# Patient Record
Sex: Male | Born: 1960 | Race: White | Hispanic: No | Marital: Married | Smoking: Never smoker
Health system: Southern US, Community
[De-identification: ages and names within clinical notes are randomized; demographics above are authoritative.]

## PROBLEM LIST (undated history)

## (undated) DIAGNOSIS — A4902 Methicillin resistant Staphylococcus aureus infection, unspecified site: Secondary | ICD-10-CM

## (undated) DIAGNOSIS — R7303 Prediabetes: Secondary | ICD-10-CM

## (undated) DIAGNOSIS — M1990 Unspecified osteoarthritis, unspecified site: Secondary | ICD-10-CM

## (undated) DIAGNOSIS — E785 Hyperlipidemia, unspecified: Secondary | ICD-10-CM

## (undated) DIAGNOSIS — I10 Essential (primary) hypertension: Secondary | ICD-10-CM

## (undated) DIAGNOSIS — C801 Malignant (primary) neoplasm, unspecified: Secondary | ICD-10-CM

## (undated) DIAGNOSIS — K219 Gastro-esophageal reflux disease without esophagitis: Secondary | ICD-10-CM

## (undated) DIAGNOSIS — G4730 Sleep apnea, unspecified: Secondary | ICD-10-CM

## (undated) HISTORY — DX: Hyperlipidemia, unspecified: E78.5

## (undated) HISTORY — DX: Methicillin resistant Staphylococcus aureus infection, unspecified site: A49.02

## (undated) HISTORY — DX: Sleep apnea, unspecified: G47.30

---

## 1999-05-04 ENCOUNTER — Ambulatory Visit: Admission: RE | Admit: 1999-05-04 | Discharge: 1999-05-04 | Payer: Self-pay | Admitting: Internal Medicine

## 2005-07-05 ENCOUNTER — Ambulatory Visit: Payer: Self-pay | Admitting: Internal Medicine

## 2006-07-04 ENCOUNTER — Ambulatory Visit: Payer: Self-pay | Admitting: Internal Medicine

## 2007-07-02 DIAGNOSIS — G4733 Obstructive sleep apnea (adult) (pediatric): Secondary | ICD-10-CM | POA: Insufficient documentation

## 2007-07-02 DIAGNOSIS — J302 Other seasonal allergic rhinitis: Secondary | ICD-10-CM | POA: Insufficient documentation

## 2007-07-02 DIAGNOSIS — J3089 Other allergic rhinitis: Secondary | ICD-10-CM

## 2007-07-03 ENCOUNTER — Ambulatory Visit: Payer: Self-pay | Admitting: Internal Medicine

## 2007-07-03 ENCOUNTER — Telehealth (INDEPENDENT_AMBULATORY_CARE_PROVIDER_SITE_OTHER): Payer: Self-pay | Admitting: *Deleted

## 2007-07-03 DIAGNOSIS — E785 Hyperlipidemia, unspecified: Secondary | ICD-10-CM | POA: Insufficient documentation

## 2008-07-01 ENCOUNTER — Ambulatory Visit: Payer: Self-pay | Admitting: Internal Medicine

## 2009-06-30 ENCOUNTER — Ambulatory Visit: Payer: Self-pay | Admitting: Internal Medicine

## 2009-07-22 ENCOUNTER — Telehealth (INDEPENDENT_AMBULATORY_CARE_PROVIDER_SITE_OTHER): Payer: Self-pay | Admitting: *Deleted

## 2009-07-27 ENCOUNTER — Encounter: Payer: Self-pay | Admitting: Internal Medicine

## 2010-03-30 ENCOUNTER — Telehealth (INDEPENDENT_AMBULATORY_CARE_PROVIDER_SITE_OTHER): Payer: Self-pay | Admitting: *Deleted

## 2010-05-25 NOTE — Progress Notes (Signed)
Summary: nasocort refill - LMTCB x1  Phone Note Call from Patient Call back at Home Phone [phone]   Caller: Patient Call For: young Reason for Call: Refill Medication, Talk to Nurse Summary of Call: Patient needing refill--nasacort--[facility] [place] Initial call taken by: Lehman Prom,  March 30, 2010 8:35 AM  Follow-up for Phone Call        last ov w/ CDY 3.8.11, next ov 3.6.12.  LMOM TCB to verify med pt is requesting refilled.  if this is correct, can refill the nasocort. Boone Master CNA/MA  March 30, 2010 9:39 AM   this is the med that is needed wife aware refill sent to pharmacy Follow-up by: Philipp Deputy CMA,  March 30, 2010 4:19 PM    Prescriptions: NASACORT AQ 55 MCG/ACT  AERS (TRIAMCINOLONE ACETONIDE(NASAL)) prn  #1 x 3   Entered by:   Philipp Deputy CMA   Authorized by:   Waymon Budge MD   Signed by:   Philipp Deputy CMA on 03/30/2010   Method used:   Electronically to        [facility] [phone]* (retail)       [address]       [place], [place]  [address]       Ph: [phone]       Fax: [phone]   RxID:   [id number]

## 2010-05-25 NOTE — Medication Information (Signed)
Summary: Tax adviser   Imported By: Valinda Hoar 07/27/2009 07:52:31  _____________________________________________________________________  External Attachment:    Type:   Image     Comment:   External Document

## 2010-05-25 NOTE — Progress Notes (Signed)
Summary: PA for Allegra D  Phone Note Outgoing Call   Call placed by: Vernie Murders,  July 22, 2009 5:02 PM Call placed to: Insurer Summary of Call: [employer] and initiated PA for [employer] D 12 hr.  Will await PA form to place in Dr Roxy Cedar lookat.  Case ID number is [id number]. Initial call taken by: Vernie Murders,  July 22, 2009 5:04 PM  Follow-up for Phone Call        PA form was received and placed in Dr Roxy Cedar lookat to be completed. Vernie Murders  July 22, 2009 5:17 PM  Letter approval was recieived and will be given to front desk to be scanned in. Follow-up by: Vernie Murders,  July 24, 2009 3:22 PM

## 2010-05-25 NOTE — Assessment & Plan Note (Signed)
Summary: 12 months/apc   Primary Provider/Referring Provider:  Dr. Larina Bras  CC:  Yearly follow up visit-needs refills on Allegra D and CPAP machine.Marland Kitchen  History of Present Illness: 3/09- One year follow up. Very good compliance. He continues to use cpap at 13cwp every night and feels it works well. we discussed cpap management. Spring pollen bothers him a little. he usually uses allegraD-12, once daily. Nasacort AQ also helps. denies chest discomfort.  07/11/08- OSA, allergic rhinitis Allergy- not much trouble. Didn't need acute visit for sinus discomfort last year. Has Allegra and Nasacort as needed. Sleep - Still using cpap 13- Advanced. Says nasal mask works ok. Machine is beginning to sound different, suggesting it is wearing out. He feels dependent on it and uses every night. Denies being told he snores through it and is not sleepy in day time. 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  June 30, 2009- OSA, allergic rhinitis Continues conmfortably using CPAP all night every night without problems.  Pressure remains at 13. Rarely feels need for his humidifier. Prefers nasal mask. Denies snore-through or daytime sleepiness.. Allergy- eyes started watering in past 2 weeks. He resumed allegra-D. Used Nasacort very rarely. Denies cough, wheeze, chest pain or palpitation.   Current Medications (verified): 1)  Allegra-D 12 Hour 60-120 Mg  Tb12 (Fexofenadine-Pseudoephedrine) .... As Needed 2)  Lipitor 20 Mg  Tabs (Atorvastatin Calcium) 3)  Adult Aspirin Ec Low Strength 81 Mg  Tbec (Aspirin) .... Take 1 Tablet By Mouth Once A Day 4)  Cpap .Marland Kitchen.. 13cwp 5)  Nasacort Aq 55 Mcg/act  Aers (Triamcinolone Acetonide(Nasal)) .... Prn 6)  Multivitamins  Tabs (Multiple Vitamin) .... Take 1 By Mouth Once Daily  Allergies (verified): 1)  ! Sulfa 2)  ! 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    Vital Signs:  Patient profile:   49 year old male Height:      70 inches Weight:      220 pounds BMI:     31.68 O2 Sat:      96 % on Room air Pulse rate:   79 / minute BP sitting:   126 / 84  (right arm) Cuff size:   regular  Vitals Entered By: Vivianne Spence  O2 Flow:  Room air  Physical Exam  Additional Exam:  General: A/Ox3; pleasant and cooperative, NAD, WDWN, appropt=riate and interactive- not sleepy appearing SKIN: no rash, lesions NODES: no lymphadenopathy HEENT: [place]/AT, EOM- WNL, Conjuctivae- clear, PERRLA, TM-WNL, Nose- clear, Throat- clear and wnl, Mallampati  IV NECK: Supple w/ fair ROM, JVD- none, normal carotid impulses w/o bruits Thyroid- normal to palpation CHEST: Clear to P&A HEART: RRR, no m/g/r heard ABDOMEN: Soft and nl; VWU:JWJX, nl pulses, no edema  NEURO: Grossly intact to observation, no tremor      Impression & Recommendations:  Problem # 1:  OBSTRUCTIVE SLEEP APNEA (ICD-327.23)  Great CPAP compliance and control. Weight and sleep  hygiene are appropriate.   Orders: Est. Patient Level III ([address])  Problem # 2:  ALLERGIC RHINITIS (ICD-477.9)  Mild seasonal rhinoconjunctivitis. We will not refill the steroid nasal spray for now. Discussed otc status of Allegra-D. His updated medication list for this problem includes:    Nasacort Aq 55 Mcg/act Aers (Triamcinolone acetonide(nasal))  .Marland Kitchen... Prn  Orders: Est. Patient Level III ([address])  Medications Added to Medication List This Visit: 1)  Cpap 13 Advanced  2)  Multivitamins Tabs (Multiple vitamin) .... Take 1 by mouth once daily  Patient Instructions: 1)  Schedule return in one year, earlier if needed 2)  continue CPAP 13. Call us or your DME company as needed.

## 2010-06-29 ENCOUNTER — Encounter: Payer: Self-pay | Admitting: Internal Medicine

## 2010-06-29 ENCOUNTER — Ambulatory Visit (INDEPENDENT_AMBULATORY_CARE_PROVIDER_SITE_OTHER): Payer: 59 | Admitting: Internal Medicine

## 2010-06-29 DIAGNOSIS — J309 Allergic rhinitis, unspecified: Secondary | ICD-10-CM

## 2010-06-29 DIAGNOSIS — G4733 Obstructive sleep apnea (adult) (pediatric): Secondary | ICD-10-CM

## 2010-07-06 NOTE — Assessment & Plan Note (Signed)
Summary: 12 month follow up   Primary Provider/Referring Provider:  Dr. Larina Bras  CC:  Yearly follow up visit-sleep apnea. Wearing CPAP each night and doing well.Marland Kitchen  History of Present Illness: 07/11/08- OSA, allergic rhinitis Allergy- not much trouble. Didn't need acute visit for sinus discomfort last year. Has Allegra and Nasacort as needed. Sleep - Still using cpap 13- Advanced. Says nasal mask works ok. Machine is beginning to sound different, suggesting it is wearing out. He feels dependent on it and uses every night. Denies being told he snores through it and is not sleepy in day time. 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  June 30, 2009- OSA, allergic rhinitis Continues conmfortably using CPAP all night every night without problems.  Pressure remains at 13. Rarely feels need for his humidifier. Prefers nasal mask. Denies snore-through or daytime sleepiness.. Allergy- eyes started watering in past 2 weeks. He resumed allegra-D. Used Nasacort very rarely. Denies cough, wheeze, chest pain or palpitation.  June 29, 2010  OSA, allergic rhinitis Nurse-CC: Yearly follow up visit-sleep apnea. Wearing CPAP each night and doing well. CPAP13- doing very well used all night, every night. Satisfied with DME company and nasal mask.  Allergies controlled without unusual problems for the tme of year. He is taking daily Allegra-D over the past 2 weeks- sufficient. He has Nasacort but hasn't resumed it.      Preventive Screening-Counseling & Management  Alcohol-Tobacco     Smoking Status: never  Current Medications (verified): 1)  Allegra-D 12 Hour 60-120 Mg  Tb12 (Fexofenadine-Pseudoephedrine) .... As Needed 2)  Lipitor 20 Mg  Tabs (Atorvastatin Calcium) 3)  Adult Aspirin Ec Low Strength 81 Mg  Tbec (Aspirin) .... Take 1 Tablet By Mouth Once A Day 4)  Cpap 13 Advanced 5)  Nasacort Aq 55 Mcg/act  Aers (Triamcinolone Acetonide(Nasal)) .... Prn 6)   Multivitamins  Tabs (Multiple Vitamin) .... Take 1 By Mouth Once Daily  Allergies (verified): 1)  ! Sulfa 2)  ! 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  The patient denies shortness of breath with activity, shortness of breath at rest, productive cough, non-productive cough, coughing up blood, chest pain, irregular heartbeats, acid heartburn, indigestion, loss of appetite, weight change, abdominal pain, difficulty swallowing, sore throat, tooth/dental problems, headaches, nasal congestion/difficulty breathing through nose, ear ache, anxiety, hand/feet swelling, rash, change in color of mucus, and fever.    Vital Signs:  Patient profile:   50 year old male Height:      70 inches Weight:      240.13 pounds BMI:     34.58 O2 Sat:      94 % on Room air Pulse rate:   91 / minute BP sitting:   136 / 76  (left arm) Cuff size:   regular  Vitals Entered By: Reynaldo Minium CMA (June 29, 2010 10:20 AM)  O2 Flow:  Room air CC: Yearly follow up visit-sleep apnea. Wearing CPAP each night and doing well.   Physical Exam  Additional Exam:  General:  A/Ox3; pleasant and cooperative, NAD, WDWN, appropt=riate and interactive- not sleepy appearing SKIN: no rash, lesions NODES: no lymphadenopathy HEENT: [place]/AT, EOM- WNL, Conjuctivae- clear, PERRLA, TM-WNL, Nose- R>L nare congested with some crusting and turbinate edema. , Throat- clear and wnl, Mallampati  IV NECK: Supple w/ fair ROM, JVD- none, normal carotid impulses w/o bruits Thyroid- normal to  palpation CHEST: Clear to P&A HEART: RRR, no m/g/r heard ABDOMEN: Soft and nl; ZOX:WRUE, nl pulses, no edema  NEURO: Grossly intact to observation, no tremor      Impression & Recommendations:  Problem # 1:  OBSTRUCTIVE SLEEP APNEA (ICD-327.23)  Good compliance and control with CPAP. We discussed comfort features.  Problem # 2:  ALLERGIC RHINITIS (ICD-477.9)  Marginally adequate control with daily Allegra-D. I suggested he resume his Nasacort now and suggested he might try otc cromolyn as an alternative.  His updated medication list for this problem includes:    Nasacort Aq 55 Mcg/act Aers (Triamcinolone acetonide(nasal)) .Marland Kitchen... 1-2 sprays each nostril once daily at bedtime  Medications Added to Medication List This Visit: 1)  Nasacort Aq 55 Mcg/act Aers (Triamcinolone acetonide(nasal)) .Marland Kitchen.. 1-2 sprays each nostril once daily at bedtime  Other Orders: Est. Patient Level III ([address])  Patient Instructions: 1)  Please schedule a follow-up appointment in 1 year. 2)  Continue CPAP 3)  Refill Nasacort sent to drug store 4)  Consider as an alternative or additional nose spray, trying otc 5)       Nasalcrom/ cromolyn 6)  follow directions on box. This might cause less nose bleed. Prescriptions: NASACORT AQ 55 MCG/ACT  AERS (TRIAMCINOLONE ACETONIDE(NASAL)) 1-2 sprays each nostril once daily at bedtime  #1 x 12   Entered and Authorized by:   Waymon Budge MD   Signed by:   Waymon Budge MD on 06/29/2010   Method used:   Electronically to        [facility] [phone]* (retail)       [address]       [place], [place]  [address]       Ph: [phone]       Fax: [phone]   RxID:   [id number]

## 2010-09-10 NOTE — Assessment & Plan Note (Signed)
[facility]                             PULMONARY OFFICE NOTE   Douglas Grimes, Douglas Grimes                    MRN:          [id number]  DATE:07/03/2005                            DOB:          28-Jul-1960    PROBLEM:  1. Obstructive sleep apnea.  2. Allergic rhinitis.   HISTORY:  One year followup.  He says he is doing very well still with  CPAP at 13 CWP.  Old machine was replaced, but no other changes needed.  In the last week as spring pollens start, he has noted increased nasal  congestion and some mildly congestive cough.  His primary physician gave  him a Z-Pak.  He has been using Allegra D occasionally and we spent an  additional 20 minutes discussing decongestants, antihistamines,  available preparations and a comparison with nasal steroids.  He has  tried Alavert D but did not find it helpful, preferring Allegra D.   MEDICATION:  1. Allegra D 12 hour used p.r.n.  2. Lipitor.  3. Aspirin 81 mg.  4. CPAP at 13 CWP.   DRUG INTOLERANCE:  SULFA AND CONTRAST DYE.   OBJECTIVE:  Weight 206 pounds.  BP 110/78, pulse 89, room air saturation  99%.  Nasal airway is not obstructed, but there is some mucus bridging.  Conjunctivae are clear.  Voice quality seems normal.  HEART:  Sounds are regular without murmur.  LUNGS:  Are clear.   IMPRESSION:  1. Obstructive sleep apnea is well controlled on CPAP at 13 CWP.  2. Rhinitis, possibly with an allergic component now.   PLAN:  1. Continue CPAP at 13 CWP.  2. Medication talk.  3. Trial of Nasacort AQ 2 sprays each nostril daily.  4. May try any of the comparison antihistamine/decongestant      preparations as discussed.  5. Schedule return in 1 year but earlier as needed.    Douglas D. Maple Hudson, MD, [name], FACP  Electronically Signed   CDY/MedQ  DD: 07/04/2006  DT: 07/06/2006  Job #: [id number]

## 2011-06-28 ENCOUNTER — Encounter: Payer: Self-pay | Admitting: Internal Medicine

## 2011-06-29 ENCOUNTER — Ambulatory Visit (INDEPENDENT_AMBULATORY_CARE_PROVIDER_SITE_OTHER): Payer: 59 | Admitting: Internal Medicine

## 2011-06-29 ENCOUNTER — Encounter: Payer: Self-pay | Admitting: Internal Medicine

## 2011-06-29 VITALS — BP 140/88 | HR 96 | Ht 72.0 in | Wt 234.6 lb

## 2011-06-29 DIAGNOSIS — G4733 Obstructive sleep apnea (adult) (pediatric): Secondary | ICD-10-CM

## 2011-06-29 DIAGNOSIS — J3089 Other allergic rhinitis: Secondary | ICD-10-CM

## 2011-06-29 DIAGNOSIS — J309 Allergic rhinitis, unspecified: Secondary | ICD-10-CM

## 2011-06-29 MED ORDER — TRIAMCINOLONE ACETONIDE(NASAL) 55 MCG/ACT NA INHA: 2.0000 | Freq: Every day | NASAL | Status: DC

## 2011-06-29 NOTE — Patient Instructions (Signed)
Order- DME- replacement CPAP machine 13 cwp, heated humidifier, mask of choice and supplies  Script to refill Nasacort AQ  Ok to continue antihistamine/ decongestant as needed- like Allegra-D  Please call as needed

## 2011-06-29 NOTE — Progress Notes (Signed)
06/29/11- 50 yoM never smoker followed for OSA, allergic rhinitis LOV-06/29/10 He returns for CPAP followup able to use his machine all night every night at 13/Advanced. Using a nasal mask and humidifier. Perennial allergy symptoms with potential seasonal flare which has not developed yet. A little itching of the eyes. No wheezing. He will resume Nasacort and antihistamine as needed. 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 Denies ear discomfort with pressure changes.  ROS-see HPI Constitutional:   No-   weight loss, night sweats, fevers, chills, fatigue, lassitude. HEENT:   No-  headaches, difficulty swallowing, tooth/dental problems, sore throat,       Some  sneezing, itching,  No-ear ache, nasal congestion, post nasal drip,  CV:  No-   chest pain, orthopnea, PND, swelling in lower extremities, anasarca,  dizziness, palpitations Resp: No-   shortness of breath with exertion or at rest.              No-   productive cough,  No non-productive cough,  No- coughing up of blood.              No-   change in color of mucus.  No- wheezing.   Skin: No-   rash or lesions. GI:  No-   heartburn, indigestion, abdominal pain, nausea, vomitinge GU:  MS:  No-   joint pain or swelling.   Neuro-     nothing unusual Psych:  No- change in mood or affect. No depression or anxiety.  No memory loss.  OBJ- Physical Exam General- Alert, Oriented, Affect-appropriate, Distress- none acute Skin- rash-none, lesions- none, excoriation- none Lymphadenopathy- none Head- atraumatic            Eyes- Gross vision intact, PERRLA, conjunctivae and secretions clear            Ears- Hearing, canals-normal            Nose- Clear, no-Septal dev, mucus, polyps, erosion, perforation             Throat- Mallampati II-III , mucosa clear , drainage- none, tonsils- atrophic Neck- flexible , trachea midline, no stridor , thyroid nl, carotid no bruit Chest - symmetrical excursion , unlabored           Heart/CV- RRR , no  murmur , no gallop  , no rub, nl s1 s2                           - JVD- none , edema- none, stasis changes- none, varices- none           Lung- clear to P&A, wheeze- none, cough- none , dullness-none, rub- none           Chest wall-  Abd-  Br/ Gen/ Rectal- Not done, not indicated Extrem- cyanosis- none, clubbing, none, atrophy- none, strength- nl Neuro- grossly intact to observation

## 2011-07-03 NOTE — Assessment & Plan Note (Signed)
Good compliance and control, and no concerns.

## 2011-07-03 NOTE — Assessment & Plan Note (Signed)
He will add nasal steroid and antihistamines back as needed during the spring season.

## 2011-07-07 ENCOUNTER — Encounter: Payer: Self-pay | Admitting: Internal Medicine

## 2012-06-28 ENCOUNTER — Encounter: Payer: Self-pay | Admitting: Internal Medicine

## 2012-06-28 ENCOUNTER — Ambulatory Visit (INDEPENDENT_AMBULATORY_CARE_PROVIDER_SITE_OTHER): Payer: 59 | Admitting: Internal Medicine

## 2012-06-28 VITALS — BP 140/82 | HR 96 | Ht 72.0 in | Wt 233.6 lb

## 2012-06-28 DIAGNOSIS — J309 Allergic rhinitis, unspecified: Secondary | ICD-10-CM

## 2012-06-28 DIAGNOSIS — J302 Other seasonal allergic rhinitis: Secondary | ICD-10-CM

## 2012-06-28 DIAGNOSIS — G4733 Obstructive sleep apnea (adult) (pediatric): Secondary | ICD-10-CM

## 2012-06-28 MED ORDER — TRIAMCINOLONE ACETONIDE(NASAL) 55 MCG/ACT NA INHA: 2.0000 | Freq: Every day | NASAL | Status: DC

## 2012-06-28 NOTE — Patient Instructions (Addendum)
Script for [employer]  We can continue CPAP 13/ Advanced  Please call as needed

## 2012-06-28 NOTE — Progress Notes (Signed)
06/29/11- 50 yoM never smoker followed for OSA, allergic rhinitis LOV-06/29/10 He returns for CPAP followup able to use his machine all night every night at 13/Advanced. Using a nasal mask and humidifier. Perennial allergy symptoms with potential seasonal flare which has not developed yet. A little itching of the eyes. No wheezing. He will resume Nasacort and antihistamine as needed. 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 Denies ear discomfort with pressure changes.  06/28/12- 51 yoM never smoker followed for OSA, allergic rhinitis FOLLOWS FOR: Wears CPAP 13/ Advanced every night for about 7 hours on average. Pressure working well for patient; not due for any new supplies at this time. New CPAP machine in last year.  No allergic rhinitis symptoms yet, but anticipates as spring comes. Needs refill Nasacort.   ROS-see HPI Constitutional:   No-   weight loss, night sweats, fevers, chills, fatigue, lassitude. HEENT:   No-  headaches, difficulty swallowing, tooth/dental problems, sore throat,       No- sneezing, itching,  No-ear ache, nasal congestion, post nasal drip,  CV:  No-   chest pain, orthopnea, PND, swelling in lower extremities, anasarca,  dizziness, palpitations Resp: No-   shortness of breath with exertion or at rest.              No-   productive cough,  No non-productive cough,  No- coughing up of blood.              No-   change in color of mucus.  No- wheezing.   Skin: No-   rash or lesions. GI:  No-   heartburn, indigestion, abdominal pain, nausea, vomitinge GU:  MS:  No-   joint pain or swelling.   Neuro-     nothing unusual Psych:  No- change in mood or affect. No depression or anxiety.  No memory loss.  OBJ- Physical Exam General- Alert, Oriented, Affect-appropriate, Distress- none acute Skin- rash-none, lesions- none, excoriation- none Lymphadenopathy- none Head- atraumatic            Eyes- Gross vision intact, PERRLA, conjunctivae and secretions clear    Ears- Hearing, canals-normal            Nose- Clear, no-Septal dev, mucus, polyps, erosion, perforation             Throat- Mallampati III-IV , mucosa clear , drainage- none, tonsils- atrophic Neck- flexible , trachea midline, no stridor , thyroid nl, carotid no bruit Chest - symmetrical excursion , unlabored           Heart/CV- RRR , no murmur , no gallop  , no rub, nl s1 s2                           - JVD- none , edema- none, stasis changes- none, varices- none           Lung- clear to P&A, wheeze- none, cough- none , dullness-none, rub- none           Chest wall-  Abd-  Br/ Gen/ Rectal- Not done, not indicated Extrem- cyanosis- none, clubbing, none, atrophy- none, strength- nl Neuro- grossly intact to observation

## 2012-06-29 NOTE — Assessment & Plan Note (Signed)
Discussed seasonality. Refill Nasacort.

## 2012-06-29 NOTE — Assessment & Plan Note (Signed)
Good compliance and control 

## 2013-06-27 ENCOUNTER — Encounter: Payer: Self-pay | Admitting: Internal Medicine

## 2013-06-27 ENCOUNTER — Ambulatory Visit (INDEPENDENT_AMBULATORY_CARE_PROVIDER_SITE_OTHER): Payer: 59 | Admitting: Internal Medicine

## 2013-06-27 VITALS — BP 130/78 | HR 98 | Ht 72.0 in | Wt 235.6 lb

## 2013-06-27 DIAGNOSIS — J302 Other seasonal allergic rhinitis: Secondary | ICD-10-CM

## 2013-06-27 DIAGNOSIS — J3089 Other allergic rhinitis: Secondary | ICD-10-CM

## 2013-06-27 DIAGNOSIS — J309 Allergic rhinitis, unspecified: Secondary | ICD-10-CM

## 2013-06-27 DIAGNOSIS — G4733 Obstructive sleep apnea (adult) (pediatric): Secondary | ICD-10-CM

## 2013-06-27 NOTE — Progress Notes (Signed)
06/29/11- 65 yoM never smoker followed for OSA, allergic rhinitis LOV-06/29/10 He returns for CPAP followup able to use his machine all night every night at 13/Advanced. Using a nasal mask and humidifier. Perennial allergy symptoms with potential seasonal flare which has not developed yet. A little itching of the eyes. No wheezing. He will resume Nasacort and antihistamine as needed. 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 Denies ear discomfort with pressure changes.  06/28/12- 1 yoM never smoker followed for OSA, allergic rhinitis FOLLOWS FOR: Wears CPAP 13/ Advanced every night for about 7 hours on average. Pressure working well for patient; not due for any new supplies at this time. New CPAP machine in last year.  No allergic rhinitis symptoms yet, but anticipates as spring comes. Needs refill Nasacort.   06/27/13- 52 yoM never smoker followed for OSA, allergic rhinitis FOLLOWS FOR: [facility]-took chip for download on Monday; Wears CPAP every night for about 7-8 hours. CPAP 13/ Advanced     Down load looks good. Doing well with no changes to request. Pending colonoscopy. We discussed taking CPAP for use in recovery.   ROS-see HPI Constitutional:   No-   weight loss, night sweats, fevers, chills, fatigue, lassitude. HEENT:   No-  headaches, difficulty swallowing, tooth/dental problems, sore throat,       No- sneezing, itching,  No-ear ache, nasal congestion, post nasal drip,  CV:  No-   chest pain, orthopnea, PND, swelling in lower extremities, anasarca,  dizziness, palpitations Resp: No-   shortness of breath with exertion or at rest.              No-   productive cough,  No non-productive cough,  No- coughing up of blood.              No-   change in color of mucus.  No- wheezing.   Skin: No-   rash or lesions. GI:  No-   heartburn, indigestion, abdominal pain, nausea, vomiting GU:  MS:  No-   joint pain or swelling.   Neuro-     nothing unusual Psych:  No- change in mood or affect.  No depression or anxiety.  No memory loss.  OBJ- Physical Exam General- Alert, Oriented, Affect-appropriate, Distress- none acute Skin- rash-none, lesions- none, excoriation- none Lymphadenopathy- none Head- atraumatic            Eyes- Gross vision intact, PERRLA, conjunctivae and secretions clear            Ears- Hearing, canals-normal            Nose- Clear, no-Septal dev, mucus, polyps, erosion, perforation             Throat- Mallampati III-IV , mucosa clear , drainage- none, tonsils- atrophic Neck- flexible , trachea midline, no stridor , thyroid nl, carotid no bruit Chest - symmetrical excursion , unlabored           Heart/CV- RRR , no murmur , no gallop  , no rub, nl s1 s2                           - JVD- none , edema- none, stasis changes- none, varices- none           Lung- clear to P&A, wheeze- none, cough- none , dullness-none, rub- none           Chest wall-  Abd-  Br/ Gen/ Rectal- Not done, not indicated  Extrem- cyanosis- none, clubbing, none, atrophy- none, strength- nl Neuro- grossly intact to observation

## 2013-06-27 NOTE — Patient Instructions (Signed)
We can continue CPAP 13/ Advanced  Recommend you take your CPAP rig to your colonoscopy and have them put it on you, at least for the recovery until you are awake.

## 2013-07-16 NOTE — Assessment & Plan Note (Signed)
Controlled for now 

## 2013-07-16 NOTE — Assessment & Plan Note (Signed)
Settings are appropriate. Take machine to colonoscopy for use in recovery while sedated.

## 2013-07-22 ENCOUNTER — Encounter (HOSPITAL_COMMUNITY): Payer: Self-pay | Admitting: *Deleted

## 2013-07-22 ENCOUNTER — Other Ambulatory Visit: Payer: Self-pay | Admitting: Gastroenterology

## 2013-07-22 ENCOUNTER — Encounter (HOSPITAL_COMMUNITY): Payer: Self-pay | Admitting: Pharmacy Technician

## 2013-08-07 ENCOUNTER — Other Ambulatory Visit: Payer: Self-pay | Admitting: Gastroenterology

## 2013-08-12 ENCOUNTER — Encounter (HOSPITAL_COMMUNITY): Payer: 59 | Admitting: Anesthesiology

## 2013-08-12 ENCOUNTER — Ambulatory Visit (HOSPITAL_COMMUNITY): Payer: 59 | Admitting: Anesthesiology

## 2013-08-12 ENCOUNTER — Ambulatory Visit (HOSPITAL_COMMUNITY)
Admission: RE | Admit: 2013-08-12 | Discharge: 2013-08-12 | Disposition: A | Discharge status: Home / Self Care | Payer: 59 | Source: Ambulatory Visit | Attending: Gastroenterology | Admitting: Gastroenterology

## 2013-08-12 ENCOUNTER — Encounter (HOSPITAL_COMMUNITY): Payer: Self-pay

## 2013-08-12 ENCOUNTER — Encounter (HOSPITAL_COMMUNITY)
Admission: RE | Disposition: A | Discharge status: Home / Self Care | Payer: Self-pay | Source: Ambulatory Visit | Attending: Gastroenterology

## 2013-08-12 DIAGNOSIS — Z1211 Encounter for screening for malignant neoplasm of colon: Secondary | ICD-10-CM | POA: Insufficient documentation

## 2013-08-12 DIAGNOSIS — G4730 Sleep apnea, unspecified: Secondary | ICD-10-CM | POA: Insufficient documentation

## 2013-08-12 DIAGNOSIS — J309 Allergic rhinitis, unspecified: Secondary | ICD-10-CM | POA: Insufficient documentation

## 2013-08-12 DIAGNOSIS — D126 Benign neoplasm of colon, unspecified: Secondary | ICD-10-CM | POA: Insufficient documentation

## 2013-08-12 DIAGNOSIS — Z79899 Other long term (current) drug therapy: Secondary | ICD-10-CM | POA: Insufficient documentation

## 2013-08-12 DIAGNOSIS — Z882 Allergy status to sulfonamides status: Secondary | ICD-10-CM | POA: Insufficient documentation

## 2013-08-12 DIAGNOSIS — K219 Gastro-esophageal reflux disease without esophagitis: Secondary | ICD-10-CM | POA: Insufficient documentation

## 2013-08-12 DIAGNOSIS — E785 Hyperlipidemia, unspecified: Secondary | ICD-10-CM | POA: Insufficient documentation

## 2013-08-12 DIAGNOSIS — Z885 Allergy status to narcotic agent status: Secondary | ICD-10-CM | POA: Insufficient documentation

## 2013-08-12 DIAGNOSIS — Z8614 Personal history of Methicillin resistant Staphylococcus aureus infection: Secondary | ICD-10-CM | POA: Insufficient documentation

## 2013-08-12 DIAGNOSIS — K5730 Diverticulosis of large intestine without perforation or abscess without bleeding: Secondary | ICD-10-CM | POA: Insufficient documentation

## 2013-08-12 DIAGNOSIS — Z91041 Radiographic dye allergy status: Secondary | ICD-10-CM | POA: Insufficient documentation

## 2013-08-12 DIAGNOSIS — Z7982 Long term (current) use of aspirin: Secondary | ICD-10-CM | POA: Insufficient documentation

## 2013-08-12 HISTORY — PX: COLONOSCOPY: SHX5424

## 2013-08-12 HISTORY — DX: Methicillin resistant Staphylococcus aureus infection, unspecified site: A49.02

## 2013-08-12 HISTORY — DX: Gastro-esophageal reflux disease without esophagitis: K21.9

## 2013-08-12 SURGERY — COLONOSCOPY
Anesthesia: Monitor Anesthesia Care

## 2013-08-12 MED ORDER — FENTANYL CITRATE 0.05 MG/ML IJ SOLN
INTRAMUSCULAR | Status: AC
  Filled 2013-08-12: qty 2

## 2013-08-12 MED ORDER — PROMETHAZINE HCL 25 MG/ML IJ SOLN: 6.2500 mg | INTRAMUSCULAR | Status: DC | PRN

## 2013-08-12 MED ORDER — MIDAZOLAM HCL 5 MG/5ML IJ SOLN
INTRAMUSCULAR | Status: DC | PRN
  Administered 2013-08-12: 1 mg via INTRAVENOUS

## 2013-08-12 MED ORDER — PROPOFOL 10 MG/ML IV BOLUS
INTRAVENOUS | Status: AC
  Filled 2013-08-12: qty 20

## 2013-08-12 MED ORDER — KETAMINE HCL 10 MG/ML IJ SOLN
INTRAMUSCULAR | Status: DC | PRN
  Administered 2013-08-12: 15 mg via INTRAVENOUS

## 2013-08-12 MED ORDER — SODIUM CHLORIDE 0.9 % IV SOLN: INTRAVENOUS | Status: DC

## 2013-08-12 MED ORDER — MIDAZOLAM HCL 2 MG/2ML IJ SOLN
INTRAMUSCULAR | Status: AC
  Filled 2013-08-12: qty 2

## 2013-08-12 MED ORDER — LACTATED RINGERS IV SOLN
INTRAVENOUS | Status: DC | PRN
Start: 2013-08-12 — End: 2013-08-12
  Administered 2013-08-12: 13:00:00 via INTRAVENOUS

## 2013-08-12 MED ORDER — PROPOFOL INFUSION 10 MG/ML OPTIME
INTRAVENOUS | Status: DC | PRN
  Administered 2013-08-12: 140 ug/kg/min via INTRAVENOUS

## 2013-08-12 MED ORDER — GLYCOPYRROLATE 0.2 MG/ML IJ SOLN
INTRAMUSCULAR | Status: AC
  Filled 2013-08-12: qty 1

## 2013-08-12 MED ORDER — LIDOCAINE HCL (CARDIAC) 20 MG/ML IV SOLN
INTRAVENOUS | Status: AC
  Filled 2013-08-12: qty 5

## 2013-08-12 MED ORDER — FENTANYL CITRATE 0.05 MG/ML IJ SOLN
INTRAMUSCULAR | Status: DC | PRN
  Administered 2013-08-12: 50 ug via INTRAVENOUS

## 2013-08-12 NOTE — Transfer of Care (Signed)
Immediate Anesthesia Transfer of Care Note  Patient: Douglas Grimes  Procedure(s) Performed: Procedure(s): COLONOSCOPY (N/A)  Patient Location: PACU  Anesthesia Type:MAC  Level of Consciousness: awake and alert   Airway & Oxygen Therapy: Patient Spontanous Breathing and Patient connected to face mask oxygen  Post-op Assessment: Report given to PACU RN and Post -op Vital signs reviewed and stable  Post vital signs: Reviewed and stable  Complications: No apparent anesthesia complications

## 2013-08-12 NOTE — H&P (Signed)
Douglas Grimes is an 52 y.o. male.   Chief Complaint: Colorectal cancer screening. HPI: Patient is here for a screening colonoscopy. He has sleep apnea. See office notes for further details.  Past Medical History  Diagnosis Date  . Sleep apnea     NPSG 1990 AHI 74.1  . Allergic rhinitis   . Hyperlipidemia   . MRSA (methicillin resistant Staphylococcus aureus)   . GERD (gastroesophageal reflux disease)   . MRSA infection     right leg-posterior are behind knee   History reviewed. No pertinent past surgical history.  Family History  Problem Relation Age of Onset  . Sleep apnea Brother   . Sleep apnea Father   . Cancer Father     urinary bladder cancer   Social History:  reports that he has never smoked. He does not have any smokeless tobacco history on file. He reports that he drinks alcohol. He reports that he does not use illicit drugs.  Allergies:  Allergies  Allergen Reactions  . Iodine Rash  . Sulfonamide Derivatives Rash  . Hydrocodone Nausea And Vomiting   Medications Prior to Admission  Medication Sig Dispense Refill  . aspirin EC 81 MG tablet Take 81 mg by mouth daily.      Marland Kitchen atorvastatin (LIPITOR) 20 MG tablet Take 20 mg by mouth daily.      . Multiple Vitamin (MULTIVITAMIN) tablet Take 1 tablet by mouth daily.      Marland Kitchen omeprazole (PRILOSEC OTC) 20 MG tablet Take 20 mg by mouth daily.      Marland Kitchen triamcinolone (NASACORT) 55 MCG/ACT nasal inhaler Place 2 sprays into the nose daily.  1 Inhaler  prn   No results found for this or any previous visit (from the past 48 hour(s)). No results found.  Review of Systems  Constitutional: Negative.   HENT: Negative.   Eyes: Negative.   Respiratory: Negative.   Cardiovascular: Negative.   Gastrointestinal: Positive for heartburn.  Genitourinary: Negative.   Musculoskeletal: Negative.   Skin: Negative.   Neurological: Negative.   Endo/Heme/Allergies: Negative.   Psychiatric/Behavioral: Negative.    Blood pressure  149/94, pulse 86, temperature 98.1 F (36.7 C), temperature source Oral, resp. rate 12, height 6' (1.829 m), weight 106.595 kg (235 lb), SpO2 95.00%. Physical Exam  Constitutional: He is oriented to person, place, and time. He appears well-developed and well-nourished.  HENT:  Head: Normocephalic and atraumatic.  Eyes: Conjunctivae and EOM are normal. Pupils are equal, round, and reactive to light.  Neck: Normal range of motion. Neck supple.  Cardiovascular: Normal rate and regular rhythm.   Respiratory: Effort normal and breath sounds normal.  GI: Soft. Bowel sounds are normal.  Musculoskeletal: Normal range of motion.  Neurological: He is alert and oriented to person, place, and time.  Skin: Skin is warm and dry.  Psychiatric: He has a normal mood and affect. His behavior is normal. Judgment and thought content normal.   Assessment/Plan Colorectal cancer screening: proceed with a colonoscopy at this time.  Juanita Craver 08/12/2013, 1:39 PM

## 2013-08-12 NOTE — Discharge Instructions (Signed)
Colonoscopy °Care After °These instructions give you information on caring for yourself after your procedure. Your doctor may also give you more specific instructions. Call your doctor if you have any problems or questions after your procedure. °HOME CARE °· Take it easy for the next 24 hours. °· Rest. °· Walk or use warm packs on your belly (abdomen) if you have belly cramping or gas. °· Do not drive for 24 hours. °· You may shower. °· Do not sign important papers or use machinery for 24 hours. °· Drink enough fluids to keep your pee (urine) clear or pale yellow. °· Resume your normal diet. Avoid heavy or fried foods. °· Avoid alcohol. °· Continue taking your normal medicines. °· Only take medicine as told by your doctor. Do not take aspirin. °If you had growths (polyps) removed: °· Do not take aspirin. °· Do not drink alcohol for 7 days or as told by your doctor. °· Eat a soft diet for 24 hours. °GET HELP RIGHT AWAY IF: °· You have a fever. °· You pass clumps of tissue (blood clots) or fill the toilet with blood. °· You have belly pain that gets worse and medicine does not help. °· Your belly is puffy (swollen). °· You feel sick to your stomach (nauseous) or throw up (vomit). °MAKE SURE YOU: °· Understand these instructions. °· Will watch your condition. °· Will get help right away if you are not doing well or get worse. °Document Released: 05/14/2010 Document Revised: 07/04/2011 Document Reviewed: 12/17/2012 °Exit[employer]® Patient Information ©2014 [employer]. ° °

## 2013-08-12 NOTE — Op Note (Signed)
[facility] [place], [address]   OPERATIVE PROCEDURE REPORT  PATIENT: Douglas Grimes, Douglas Grimes  MR#: [id number] BIRTHDATE: 07-Aug-1960 GENDER: Male ENDOSCOPIST:  Edmonia James, MD ASSISTANT:   William Dalton, technician Luanne Bras, RN CGRN PROCEDURE DATE: 08/12/2013 PRE-PROCEDURE PREPARATION: The patient was prepped with a gallon of Golytely the night prior to the procedure.  The patient was fasted for 8 hours prior to the procedure.  PRE-PROCEDURE PHYSICAL: Patient has stable vital signs.  Neck is supple.  There is no JVD, thyromegaly or LAD.  Chest clear to auscultation.  S1 and S2 regular.  Abdomen soft, non-distended, non-tender with NABS. PROCEDURE:     Colonoscopy with cold biopsies x 5. ASA CLASS:     Class II INDICATIONS:     1.  Colorectal cancer screening 2.Change in bowel habits.Marland Kitchen MEDICATIONS:     Propofol (Diprivan) 150 mg, Fentanyl 50 mcg and Versed 1mg  IV.  DESCRIPTION OF PROCEDURE: After the risks, benefits, and alternatives of the procedure were thoroughly explained [including a 10% missed rate of cancer and polyps], informed consent was obtained.  Digital rectal exam was performed. The EC-[id number] ([id number])  was introduced through the anus  and advanced to the terminal ileum which was intubated for a short distance , limited by No adverse events experienced.  The quality of the prep was excellent. . Multiple washes were done. Small lesions could be missed. The instrument was then slowly withdrawn as the colon was fully examined.     COLON FINDINGS: Five diminutive sessile polyps were found in the descending colon and were removed by cold biopsies. There were a few scattered diverticula noted. The rest of the colonic mucosa appeared healthy with a normal vascular pattern.  No masses or AVMs were noted. The appendiceal orifice and the ICV were identified and photographed. The terminal ileum appeared normal.  Retroflexed views  revealed no abnormalities.  The patient tolerated the procedure without immediate complications.  The scope was then withdrawn from the patient and the procedure terminated.  TIME TO CECUM:   5 minutes 00 seconds WITHDRAW TIME:  6 minutes 00 seconds  IMPRESSION:     1.  Five diminutive sessile polyps were found in the descending colon-removed by cold biopsies. 2.  Few scattered diverticula in the colon. 3. Normal terminal ileum.  RECOMMENDATIONS:     1.  Await pathology results. 2.  Continue current medications. 3.  Continue surveillance. 4.  High fiber diet with liberal fluid intake. 5.  OP follow-up is advised on a PRN basis.   REPEAT EXAM:      In 5-10 years  for a repeat colonoscopy.  If the patient has any abnormal GI symptoms in the interim, he have been advised to contact the office as soon as possible for further recommendations.   CPT CODES:     [phone], Colonoscopy with Biopses   DIAGNOSIS CODES:     V76.51 Colorectal cancer screening, 787.99 Change in bowel habits  REFERRED BY: Imagene Riches ,MD Wilhemina Bonito, MD  eSigned:  Dr. Edmonia James, MD 08/12/2013 2:21 PM   PATIENT NAME:  Douglas Grimes, Douglas Grimes MR#: [id number]

## 2013-08-12 NOTE — Anesthesia Postprocedure Evaluation (Signed)
  Anesthesia Post-op Note  Patient: Douglas Grimes  Procedure(s) Performed: Procedure(s) (LRB): COLONOSCOPY (N/A)  Patient Location: PACU  Anesthesia Type: MAC   Level of Consciousness: awake and alert   Airway and Oxygen Therapy: Patient Spontanous Breathing  Post-op Pain: mild  Post-op Assessment: Post-op Vital signs reviewed, Patient's Cardiovascular Status Stable, Respiratory Function Stable, Patent Airway and No signs of Nausea or vomiting  Last Vitals:  Filed Vitals:   08/12/13 1414  BP:   Pulse:   Temp: 36.7 C  Resp:     Post-op Vital Signs: stable   Complications: No apparent anesthesia complications

## 2013-08-12 NOTE — Anesthesia Preprocedure Evaluation (Addendum)
Anesthesia Evaluation  Patient identified by MRN, date of birth, ID band Patient awake    Reviewed: Allergy & Precautions, H&P , NPO status , Patient's Chart, lab work & pertinent test results  Airway Mallampati: II TM Distance: >3 FB Neck ROM: Full    Dental no notable dental hx.    Pulmonary sleep apnea ,  breath sounds clear to auscultation  Pulmonary exam normal       Cardiovascular negative cardio ROS  Rhythm:Regular Rate:Normal     Neuro/Psych negative neurological ROS  negative psych ROS   GI/Hepatic Neg liver ROS, GERD-  Medicated,  Endo/Other  negative endocrine ROS  Renal/GU negative Renal ROS  negative genitourinary   Musculoskeletal negative musculoskeletal ROS (+)   Abdominal   Peds negative pediatric ROS (+)  Hematology negative hematology ROS (+)   Anesthesia Other Findings   Reproductive/Obstetrics negative OB ROS                           Anesthesia Physical Anesthesia Plan  ASA: II  Anesthesia Plan: MAC   Post-op Pain Management:    Induction: Intravenous  Airway Management Planned: Nasal Cannula  Additional Equipment:   Intra-op Plan:   Post-operative Plan:   Informed Consent: I have reviewed the patients History and Physical, chart, labs and discussed the procedure including the risks, benefits and alternatives for the proposed anesthesia with the patient or authorized representative who has indicated his/her understanding and acceptance.   Dental advisory given  Plan Discussed with: CRNA and Surgeon  Anesthesia Plan Comments:         Anesthesia Quick Evaluation

## 2013-08-13 ENCOUNTER — Encounter (HOSPITAL_COMMUNITY): Payer: Self-pay | Admitting: Gastroenterology

## 2014-05-08 ENCOUNTER — Other Ambulatory Visit: Payer: Self-pay | Admitting: Dermatology

## 2014-07-01 ENCOUNTER — Encounter: Payer: Self-pay | Admitting: Internal Medicine

## 2014-07-01 ENCOUNTER — Ambulatory Visit (INDEPENDENT_AMBULATORY_CARE_PROVIDER_SITE_OTHER): Payer: 59 | Admitting: Internal Medicine

## 2014-07-01 VITALS — BP 126/82 | HR 65 | Ht 72.0 in | Wt 224.0 lb

## 2014-07-01 DIAGNOSIS — J302 Other seasonal allergic rhinitis: Secondary | ICD-10-CM

## 2014-07-01 DIAGNOSIS — J3089 Other allergic rhinitis: Principal | ICD-10-CM

## 2014-07-01 DIAGNOSIS — G4733 Obstructive sleep apnea (adult) (pediatric): Secondary | ICD-10-CM

## 2014-07-01 DIAGNOSIS — J309 Allergic rhinitis, unspecified: Secondary | ICD-10-CM

## 2014-07-01 NOTE — Patient Instructions (Addendum)
Ok to continue CPAP 13/ [facility]  Check at your drug store for simple mouth pieces for bruxism/ tooth grinding.

## 2014-07-01 NOTE — Progress Notes (Signed)
06/29/11- 62 yoM never smoker followed for OSA, allergic rhinitis LOV-06/29/10 He returns for CPAP followup able to use his machine all night every night at 13/[facility]. Using a nasal mask and humidifier. Perennial allergy symptoms with potential seasonal flare which has not developed yet. A little itching of the eyes. No wheezing. He will resume Nasacort and antihistamine as needed. 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 Denies ear discomfort with pressure changes.  06/28/12- 89 yoM never smoker followed for OSA, allergic rhinitis FOLLOWS FOR: Wears CPAP 13/ [facility] every night for about 7 hours on average. Pressure working well for patient; not due for any new supplies at this time. New CPAP machine in last year.  No allergic rhinitis symptoms yet, but anticipates as spring comes. Needs refill Nasacort.   06/27/13- 52 yoM never smoker followed for OSA, allergic rhinitis FOLLOWS FOR: [facility]-took chip for download on Monday; Wears CPAP every night for about 7-8 hours. CPAP 13/ [facility]     Down load looks good. Doing well with no changes to request. Pending colonoscopy. We discussed taking CPAP for use in recovery.   07/01/14- 80 yoM never smoker followed for OSA, allergic rhinitis FOLLOWS FOR: Pt wears CPAP 13/ 7-8 hrs nightly DME-[facility]. Pt is not having any problems. Rhinitis managed with nasal steroid spray. Has mouthpiece for bruxism but it tends to obstruct his breathing when he is lying down and wearing CPAP.  ROS-see HPI Constitutional:   No-   weight loss, night sweats, fevers, chills, fatigue, lassitude. HEENT:   No-  headaches, difficulty swallowing, tooth/dental problems, sore throat,       No- sneezing, itching,  No-ear ache, +nasal congestion, post nasal drip,  CV:  No-   chest pain, orthopnea, PND, swelling in lower extremities, anasarca,  dizziness, palpitations Resp: No-   shortness of breath with exertion or at rest.              No-   productive cough,  No non-productive  cough,  No- coughing up of blood.              No-   change in color of mucus.  No- wheezing.   Skin: No-   rash or lesions. GI:  No-   heartburn, indigestion, abdominal pain, nausea, vomiting GU:  MS:  No-   joint pain or swelling.   Neuro-     nothing unusual Psych:  No- change in mood or affect. No depression or anxiety.  No memory loss.  OBJ- Physical Exam General- Alert, Oriented, Affect-appropriate, Distress- none acute Skin- rash-none, lesions- none, excoriation- none Lymphadenopathy- none Head- atraumatic            Eyes- Gross vision intact, PERRLA, conjunctivae and secretions clear            Ears- Hearing, canals-normal            Nose- Clear, no-Septal dev, mucus, polyps, erosion, perforation             Throat- Mallampati III-IV , mucosa clear , drainage- none, tonsils- atrophic Neck- flexible , trachea midline, no stridor , thyroid nl, carotid no bruit Chest - symmetrical excursion , unlabored           Heart/CV- RRR , no murmur , no gallop  , no rub, nl s1 s2                           - JVD- none ,  edema- none, stasis changes- none, varices- none           Lung- clear to P&A, wheeze- none, cough- none , dullness-none, rub- none           Chest wall-  Abd-  Br/ Gen/ Rectal- Not done, not indicated Extrem- cyanosis- none, clubbing, none, atrophy- none, strength- nl Neuro- grossly intact to observation

## 2014-07-06 NOTE — Assessment & Plan Note (Signed)
Nasal steroid spray if necessary

## 2014-07-06 NOTE — Assessment & Plan Note (Signed)
Good compliance and control. CPAP remains medically necessary. Blood pressure under better control.

## 2015-07-01 ENCOUNTER — Ambulatory Visit (INDEPENDENT_AMBULATORY_CARE_PROVIDER_SITE_OTHER): Payer: 59 | Admitting: Internal Medicine

## 2015-07-01 ENCOUNTER — Encounter: Payer: Self-pay | Admitting: Internal Medicine

## 2015-07-01 VITALS — BP 122/80 | HR 81 | Ht 72.0 in | Wt 233.4 lb

## 2015-07-01 DIAGNOSIS — J309 Allergic rhinitis, unspecified: Secondary | ICD-10-CM | POA: Diagnosis not present

## 2015-07-01 DIAGNOSIS — G4733 Obstructive sleep apnea (adult) (pediatric): Secondary | ICD-10-CM | POA: Diagnosis not present

## 2015-07-01 DIAGNOSIS — H9221 Otorrhagia, right ear: Secondary | ICD-10-CM | POA: Diagnosis not present

## 2015-07-01 DIAGNOSIS — J3089 Other allergic rhinitis: Secondary | ICD-10-CM

## 2015-07-01 DIAGNOSIS — J302 Other seasonal allergic rhinitis: Secondary | ICD-10-CM

## 2015-07-01 NOTE — Patient Instructions (Signed)
We can continue CPAP 13/ [facility]  Consider trying otc Nasalcrom/cromolyn/cromol nasal spray instead of a steroid spray like nasonex or flonase.

## 2015-07-01 NOTE — Assessment & Plan Note (Signed)
Quality of life is still very good with CPAP. Download confirms great compliance and control. No changes appropriate at this time.

## 2015-07-01 NOTE — Assessment & Plan Note (Signed)
Nasonex was associated with headaches. Plan-suggest he try Nasalcrom OTC if needed

## 2015-07-01 NOTE — Progress Notes (Signed)
06/29/11- 11 yoM never smoker followed for OSA, allergic rhinitis LOV-06/29/10 He returns for CPAP followup able to use his machine all night every night at 13/[facility]. Using a nasal mask and humidifier. Perennial allergy symptoms with potential seasonal flare which has not developed yet. A little itching of the eyes. No wheezing. He will resume Nasacort and antihistamine as needed. 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 Denies ear discomfort with pressure changes.  06/28/12- 31 yoM never smoker followed for OSA, allergic rhinitis FOLLOWS FOR: Wears CPAP 13/ [facility] every night for about 7 hours on average. Pressure working well for patient; not due for any new supplies at this time. New CPAP machine in last year.  No allergic rhinitis symptoms yet, but anticipates as spring comes. Needs refill Nasacort.   06/27/13- 52 yoM never smoker followed for OSA, allergic rhinitis FOLLOWS FOR: [facility]-took chip for download on Monday; Wears CPAP every night for about 7-8 hours. CPAP 13/ [facility]     Down load looks good. Doing well with no changes to request. Pending colonoscopy. We discussed taking CPAP for use in recovery.   07/01/14- 44 yoM never smoker followed for OSA, allergic rhinitis FOLLOWS FOR: Pt wears CPAP 13/ 7-8 hrs nightly DME-[facility]. Pt is not having any problems. Rhinitis managed with nasal steroid spray. Has mouthpiece for bruxism but it tends to obstruct his breathing when he is lying down and wearing CPAP.  07/01/2015-55 year old male never smoker followed for OSA, allergic rhinitis CPAP 13/[facility] FOLLOWS FOR: DME:[facility] Wears CPAP every night;DL attached Very satisfied with CPAP with no concerns or questions. Download is excellent. Nasonex for seasonal rhinitis seemed associated with headaches which stopped when that medicine was discontinued a month ago. Not troubled yet this spring. Had a little bit of blood on pillow associated with right ear 2 or 3 times in the past month  without pain or change in hearing. Wears hearing aid in left ear.  ROS-see HPI Constitutional:   No-   weight loss, night sweats, fevers, chills, fatigue, lassitude. HEENT:   No-  headaches, difficulty swallowing, tooth/dental problems, sore throat,       No- sneezing, itching,  No-ear ache, +nasal congestion, post nasal drip,  CV:  No-   chest pain, orthopnea, PND, swelling in lower extremities, anasarca,  dizziness, palpitations Resp: No-   shortness of breath with exertion or at rest.              No-   productive cough,  No non-productive cough,  No- coughing up of blood.              No-   change in color of mucus.  No- wheezing.   Skin: No-   rash or lesions. GI:  No-   heartburn, indigestion, abdominal pain, nausea, vomiting GU:  MS:  No-   joint pain or swelling.   Neuro-     nothing unusual Psych:  No- change in mood or affect. No depression or anxiety.  No memory loss.  OBJ- Physical Exam General- Alert, Oriented, Affect-appropriate, Distress- none acute Skin- rash-none, lesions- none, excoriation- none Lymphadenopathy- none Head- atraumatic            Eyes- Gross vision intact, PERRLA, conjunctivae and secretions clear            Ears- Hearing aid left ear. Proximal right canal + raised lesion c/w pimple about 8:00 0'clock. No blood seen            Nose-  Clear, no-Septal dev, mucus, polyps, erosion, perforation             Throat- Mallampati III-IV , mucosa clear , drainage- none, tonsils- atrophic Neck- flexible , trachea midline, no stridor , thyroid nl, carotid no bruit Chest - symmetrical excursion , unlabored           Heart/CV- RRR , no murmur , no gallop  , no rub, nl s1 s2                           - JVD- none , edema- none, stasis changes- none, varices- none           Lung- clear to P&A, wheeze- none, cough- none , dullness-none, rub- none           Chest wall-  Abd-  Br/ Gen/ Rectal- Not done, not indicated Extrem- cyanosis- none, clubbing, none, atrophy- none,  strength- nl Neuro- grossly intact to observation

## 2015-07-01 NOTE — Assessment & Plan Note (Signed)
Smooth raised lesion about 8:00 in the proximal right external canal, may be a small pimple which could have bled intermittently. Suggested hot washcloth. If he continues to have problems see ENT

## 2016-06-23 HISTORY — PX: PROSTATECTOMY: SHX69

## 2016-06-26 ENCOUNTER — Encounter: Payer: Self-pay | Admitting: Internal Medicine

## 2016-06-30 ENCOUNTER — Ambulatory Visit (INDEPENDENT_AMBULATORY_CARE_PROVIDER_SITE_OTHER): Payer: 59 | Admitting: Internal Medicine

## 2016-06-30 ENCOUNTER — Encounter: Payer: Self-pay | Admitting: Internal Medicine

## 2016-06-30 VITALS — BP 136/90 | HR 80 | Ht 72.0 in | Wt 216.4 lb

## 2016-06-30 DIAGNOSIS — J3089 Other allergic rhinitis: Secondary | ICD-10-CM | POA: Diagnosis not present

## 2016-06-30 DIAGNOSIS — G4733 Obstructive sleep apnea (adult) (pediatric): Secondary | ICD-10-CM

## 2016-06-30 DIAGNOSIS — J302 Other seasonal allergic rhinitis: Secondary | ICD-10-CM | POA: Diagnosis not present

## 2016-06-30 NOTE — Assessment & Plan Note (Signed)
He will try to stay ahead of this is the season progresses, using Flonase and antihistamine as needed. It has not been interfering with his use of CPAP.

## 2016-06-30 NOTE — Assessment & Plan Note (Addendum)
Download confirms great compliance and control. He will be able to take his own mask and use the hospital CPAP machine 13 CWP at the time of prostate cancer surgery with anesthesia.

## 2016-06-30 NOTE — Patient Instructions (Signed)
Order- DME- [facility]- Continue CPAP 13, mask of choice, humidifier, supplies, AirView   Dx OSA                              Please evaluate eligibility for replacement of old machine, with change to auto 5-15  Recommend CPAP during recovery and sleep at time of surgery- discuss with your surgeon  Please call if we can help

## 2016-06-30 NOTE — Progress Notes (Signed)
HPI male never smoker followed for OSA, allergic rhinitis, complicated by GERD, prostate cancer, NPSG 10/04/88- AHI 74.1/ hr, desaturation to 67% -----------------------------------------------------------------------------------  07/01/2015-56 year old male never smoker followed for OSA, allergic rhinitis CPAP 13/[facility] FOLLOWS FOR: DME:[facility] Wears CPAP every night;DL attached Very satisfied with CPAP with no concerns or questions. Download is excellent. Nasonex for seasonal rhinitis seemed associated with headaches which stopped when that medicine was discontinued a month ago. Not troubled yet this spring. Had a little bit of blood on pillow associated with right ear 2 or 3 times in the past month without pain or change in hearing. Wears hearing aid in left ear.  06/30/2016-56 year old male never smoker followed for OSA, allergic rhinitis, complicated by GERD, prostate cancer, CPAP 13/[facility] Follows For: OSA, pt is doing ok with CPAP Download excellent compliance 100%, excellent control AHI 1.9/hour. He feels very comfortable with this pressure. Facing surgery for prostate cancer. We discussed recommendation for use of CPAP while sedated-recovery and while sleeping. He can take his own mask. He will discuss with his surgeon and anesthesiologist.  Anticipates Zyrtec if needed for allergic rhinitis this spring. Currently doing okay. No cough or wheeze.  ROS-see HPI Constitutional:   No-   weight loss, night sweats, fevers, chills, fatigue, lassitude. HEENT:   No-  headaches, difficulty swallowing, tooth/dental problems, sore throat,       No- sneezing, itching,  No-ear ache, +nasal congestion, post nasal drip,  CV:  No-   chest pain, orthopnea, PND, swelling in lower extremities, anasarca,  dizziness, palpitations Resp: No-   shortness of breath with exertion or at rest.              No-   productive cough,  No non-productive cough,  No- coughing up of blood.              No-   change in  color of mucus.  No- wheezing.   Skin: No-   rash or lesions. GI:  No-   heartburn, indigestion, abdominal pain, nausea, vomiting GU:  MS:  No-   joint pain or swelling.   Neuro-     nothing unusual Psych:  No- change in mood or affect. No depression or anxiety.  No memory loss.  OBJ- Physical Exam   +stable baseline exam General- Alert, Oriented, Affect-appropriate, Distress- none acute Skin- rash-none, lesions- none, excoriation- none Lymphadenopathy- none Head- atraumatic            Eyes- Gross vision intact, PERRLA, conjunctivae and secretions clear            Ears- +Hearing aid left ear.             Nose- Clear, no-Septal dev, mucus, polyps, erosion, perforation             Throat- Mallampati III-IV , mucosa clear , drainage- none, tonsils- atrophic Neck- flexible , trachea midline, no stridor , thyroid nl, carotid no bruit Chest - symmetrical excursion , unlabored           Heart/CV- RRR , no murmur , no gallop  , no rub, nl s1 s2                           - JVD- none , edema- none, stasis changes- none, varices- none           Lung- clear to P&A, wheeze- none, cough- none , dullness-none, rub- none  Chest wall-  Abd-  Br/ Gen/ Rectal- Not done, not indicated Extrem- cyanosis- none, clubbing, none, atrophy- none, strength- nl Neuro- grossly intact to observation

## 2016-07-01 ENCOUNTER — Telehealth: Payer: Self-pay | Admitting: Internal Medicine

## 2016-07-01 NOTE — Telephone Encounter (Signed)
Noted  

## 2017-02-03 ENCOUNTER — Encounter: Payer: Self-pay | Admitting: Internal Medicine

## 2017-02-14 ENCOUNTER — Encounter: Payer: Self-pay | Admitting: Internal Medicine

## 2017-02-15 ENCOUNTER — Encounter: Payer: Self-pay | Admitting: Internal Medicine

## 2017-02-15 ENCOUNTER — Ambulatory Visit (INDEPENDENT_AMBULATORY_CARE_PROVIDER_SITE_OTHER): Payer: 59 | Admitting: Internal Medicine

## 2017-02-15 VITALS — BP 130/70 | HR 80 | Ht 72.0 in | Wt 226.4 lb

## 2017-02-15 DIAGNOSIS — J320 Chronic maxillary sinusitis: Secondary | ICD-10-CM | POA: Diagnosis not present

## 2017-02-15 DIAGNOSIS — G4733 Obstructive sleep apnea (adult) (pediatric): Secondary | ICD-10-CM | POA: Diagnosis not present

## 2017-02-15 NOTE — Patient Instructions (Signed)
Order- DME [facility]- Please change autopap range to 5-20, continue mask of choice, supplies.  Please check humidifier for leaks.     Dx OSA  Order- schedule limited CT max/ face   Dx chronic maxillary sinusitis  Please call as needed

## 2017-02-15 NOTE — Progress Notes (Signed)
HPI male never smoker followed for OSA, allergic rhinitis, complicated by GERD, prostate cancer, NPSG 10/04/88- AHI 74.1/ hr, desaturation to 67% ------------------------------------------------------------------------------------  06/30/2016-56 year old male never smoker followed for OSA, allergic rhinitis, complicated by GERD, prostate cancer, CPAP 13/[facility] Follows For: OSA, pt is doing ok with CPAP Download excellent compliance 100%, excellent control AHI 1.9/hour. He feels very comfortable with this pressure. Facing surgery for prostate cancer. We discussed recommendation for use of CPAP while sedated-recovery and while sleeping. He can take his own mask. He will discuss with his surgeon and anesthesiologist.  Anticipates Zyrtec if needed for allergic rhinitis this spring. Currently doing okay. No cough or wheeze.  02/15/17-  56 year old male never smoker followed for OSA, allergic rhinitis, complicated by GERD, prostate cancer, CPAP auto 5-15/[facility]> today changed to 5-20 --CPAP machine follow up for insurance purposes-- replaced old machine Download 100% 4 hour compliance averaging over 7 hours per night, AHI 2.6/hour. Actual pressure is running around 15 so we are going to increase his range to auto 5-20 Dentist told him he had an opacified sinus on dental x-ray. Occasional headache.  ROS-see HPI + = positive Constitutional:   No-   weight loss, night sweats, fevers, chills, fatigue, lassitude. HEENT:   No-  headaches, difficulty swallowing, tooth/dental problems, sore throat,       No- sneezing, itching,  No-ear ache, +nasal congestion, post nasal drip,  CV:  No-   chest pain, orthopnea, PND, swelling in lower extremities, anasarca,  dizziness, palpitations Resp: No-   shortness of breath with exertion or at rest.              No-   productive cough,  No non-productive cough,  No- coughing up of blood.              No-   change in color of mucus.  No- wheezing.   Skin: No-   rash  or lesions. GI:  No-   heartburn, indigestion, abdominal pain, nausea, vomiting GU:  MS:  No-   joint pain or swelling.   Neuro-     nothing unusual Psych:  No- change in mood or affect. No depression or anxiety.  No memory loss.  OBJ- Physical Exam   +stable baseline exam General- Alert, Oriented, Affect-appropriate, Distress- none acute Skin- rash-none, lesions- none, excoriation- none Lymphadenopathy- none Head- atraumatic            Eyes- Gross vision intact, PERRLA, conjunctivae and secretions clear            Ears- +Hearing aid left ear.             Nose- Clear, no-Septal dev, mucus, polyps, erosion, perforation             Throat- Mallampati III-IV , mucosa clear , drainage- none, tonsils- atrophic Neck- flexible , trachea midline, no stridor , thyroid nl, carotid no bruit Chest - symmetrical excursion , unlabored           Heart/CV- RRR , no murmur , no gallop  , no rub, nl s1 s2                           - JVD- none , edema- none, stasis changes- none, varices- none           Lung- clear to P&A, wheeze- none, cough- none , dullness-none, rub- none           Chest wall-  Abd-  Br/ Gen/ Rectal- Not done, not indicated Extrem- cyanosis- none, clubbing, none, atrophy- none, strength- nl Neuro- grossly intact to observation

## 2017-02-20 ENCOUNTER — Ambulatory Visit (INDEPENDENT_AMBULATORY_CARE_PROVIDER_SITE_OTHER)
Admission: RE | Admit: 2017-02-20 | Discharge: 2017-02-20 | Disposition: A | Discharge status: Home / Self Care | Payer: 59 | Source: Ambulatory Visit | Attending: Internal Medicine | Admitting: Internal Medicine

## 2017-02-20 DIAGNOSIS — J320 Chronic maxillary sinusitis: Secondary | ICD-10-CM

## 2017-02-26 DIAGNOSIS — J320 Chronic maxillary sinusitis: Secondary | ICD-10-CM | POA: Insufficient documentation

## 2017-02-26 NOTE — Assessment & Plan Note (Signed)
We agreed to try increasing the pressure range on his CPAP to 5-20. He continues to benefit and is used CPAP now for many years.

## 2017-02-26 NOTE — Assessment & Plan Note (Signed)
Not very symptomatic. Dentist told him on dental x-ray he appeared to have an opacified sinus. Plan-limited CT max fac to assess for chronic sinusitis

## 2017-07-04 ENCOUNTER — Ambulatory Visit: Payer: 59 | Admitting: Internal Medicine

## 2018-02-13 ENCOUNTER — Encounter: Payer: Self-pay | Admitting: Internal Medicine

## 2018-02-15 ENCOUNTER — Encounter: Payer: Self-pay | Admitting: Internal Medicine

## 2018-02-15 ENCOUNTER — Ambulatory Visit (INDEPENDENT_AMBULATORY_CARE_PROVIDER_SITE_OTHER): Payer: 59 | Admitting: Internal Medicine

## 2018-02-15 VITALS — BP 132/92 | HR 70 | Ht 71.75 in | Wt 239.8 lb

## 2018-02-15 DIAGNOSIS — J320 Chronic maxillary sinusitis: Secondary | ICD-10-CM

## 2018-02-15 DIAGNOSIS — J302 Other seasonal allergic rhinitis: Secondary | ICD-10-CM

## 2018-02-15 DIAGNOSIS — G4733 Obstructive sleep apnea (adult) (pediatric): Secondary | ICD-10-CM | POA: Diagnosis not present

## 2018-02-15 DIAGNOSIS — J3089 Other allergic rhinitis: Secondary | ICD-10-CM | POA: Diagnosis not present

## 2018-02-15 NOTE — Assessment & Plan Note (Signed)
He continues to benefit from CPAP with improved sleep.  He would like a higher starting pressure and we agreed to try 12. Plan-change AutoPap to 12-20

## 2018-02-15 NOTE — Assessment & Plan Note (Signed)
Good control this year without significant exacerbation.  He can use Flonase and Claritin or equivalent if needed.

## 2018-02-15 NOTE — Progress Notes (Signed)
HPI male never smoker followed for OSA, allergic rhinitis, complicated by GERD, prostate cancer, NPSG 10/04/88- AHI 74.1/ hr, desaturation to 67% ------------------------------------------------------------------------------------  02/15/17-  57 year old male never smoker followed for OSA, allergic rhinitis, complicated by GERD, prostate cancer, CPAP auto 5-15/[facility]> today changed to 5-20 --CPAP machine follow up for insurance purposes-- replaced old machine Download 100% 4 hour compliance averaging over 7 hours per night, AHI 2.6/hour. Actual pressure is running around 15 so we are going to increase his range to auto 5-20 Dentist told him he had an opacified sinus on dental x-ray. Occasional headache.  02/15/2018- 57 year old male never smoker followed for OSA, allergic rhinitis, complicated by GERD, prostate cancer, CPAP auto 5-20/[facility]>> today 12-20 -----Follows for: OSA, request setting change, hard to breathe when he first puts in on, otherwise doing well on CPAP Download 100% compliance AHI 2.7/hour.  He would like to raise the low end because it is not enough pressure when he first puts the machine on. Denies any rhinitis or sinus symptoms with this fall. Asks to defer flu shot until after family trip coming up.  ROS-see HPI + = positive Constitutional:   No-   weight loss, night sweats, fevers, chills, fatigue, lassitude. HEENT:   No-  headaches, difficulty swallowing, tooth/dental problems, sore throat,       No- sneezing, itching,  No-ear ache, +nasal congestion, post nasal drip,  CV:  No-   chest pain, orthopnea, PND, swelling in lower extremities, anasarca,  dizziness, palpitations Resp: No-   shortness of breath with exertion or at rest.              No-   productive cough,  No non-productive cough,  No- coughing up of blood.              No-   change in color of mucus.  No- wheezing.   Skin: No-   rash or lesions. GI:  No-   heartburn, indigestion, abdominal pain,  nausea, vomiting GU:  MS:  No-   joint pain or swelling.   Neuro-     nothing unusual Psych:  No- change in mood or affect. No depression or anxiety.  No memory loss.  OBJ- Physical Exam    General- Alert, Oriented, Affect-appropriate, Distress- none acute Skin- rash-none, lesions- none, excoriation- none Lymphadenopathy- none Head- atraumatic            Eyes- Gross vision intact, PERRLA, conjunctivae and secretions clear            Ears- +Hearing aid left ear.             Nose- Clear, no-Septal dev, mucus, polyps, erosion, perforation             Throat- Mallampati III-IV , mucosa clear , drainage- none, tonsils- atrophic Neck- flexible , trachea midline, no stridor , thyroid nl, carotid no bruit Chest - symmetrical excursion , unlabored           Heart/CV- RRR , no murmur , no gallop  , no rub, nl s1 s2                           - JVD- none , edema- none, stasis changes- none, varices- none           Lung- clear to P&A, wheeze- none, cough- none , dullness-none, rub- none           Chest wall-  Abd-  Br/ Gen/ Rectal-  Not done, not indicated Extrem- cyanosis- none, clubbing, none, atrophy- none, strength- nl Neuro- grossly intact to observation

## 2018-02-15 NOTE — Patient Instructions (Signed)
Order- [facility]ME [facility]- please change auto range to 12-20, continue mask of choice, humidifier supplies, AirView   Please call if we can help

## 2019-02-17 ENCOUNTER — Encounter: Payer: Self-pay | Admitting: Internal Medicine

## 2019-02-20 ENCOUNTER — Ambulatory Visit (INDEPENDENT_AMBULATORY_CARE_PROVIDER_SITE_OTHER): Payer: Commercial Managed Care - PPO | Admitting: Internal Medicine

## 2019-02-20 ENCOUNTER — Other Ambulatory Visit: Payer: Self-pay

## 2019-02-20 ENCOUNTER — Encounter: Payer: Self-pay | Admitting: Internal Medicine

## 2019-02-20 VITALS — BP 128/60 | HR 69 | Temp 97.3°F | Ht 71.75 in | Wt 234.2 lb

## 2019-02-20 DIAGNOSIS — Z23 Encounter for immunization: Secondary | ICD-10-CM

## 2019-02-20 DIAGNOSIS — C61 Malignant neoplasm of prostate: Secondary | ICD-10-CM | POA: Diagnosis not present

## 2019-02-20 DIAGNOSIS — G4733 Obstructive sleep apnea (adult) (pediatric): Secondary | ICD-10-CM | POA: Diagnosis not present

## 2019-02-20 DIAGNOSIS — I10 Essential (primary) hypertension: Secondary | ICD-10-CM | POA: Diagnosis not present

## 2019-02-20 NOTE — Progress Notes (Signed)
HPI male never smoker followed for OSA, allergic rhinitis, complicated by GERD, prostate cancer, NPSG 10/04/88- AHI 74.1/ hr, desaturation to 67% ------------------------------------------------------------------------------------  02/15/2018- 58 year old male never smoker followed for OSA, allergic rhinitis, complicated by GERD, prostate cancer, CPAP auto 5-20/[facility]>> today 12-20 -----Follows for: OSA, request setting change, hard to breathe when he first puts in on, otherwise doing well on CPAP Download 100% compliance AHI 2.7/hour.  He would like to raise the low end because it is not enough pressure when he first puts the machine on. Denies any rhinitis or sinus symptoms with this fall. Asks to defer flu shot until after family trip coming up.  02/20/2019- 58 year old male never smoker followed for OSA, allergic rhinitis, complicated by GERD, prostate cancer/ radical resection, -----OSA on CPAP, DME: [facility]; no complaints other than mask leak  CPAP auto 12-20/ [facility] Body weight today 234 lbs Download compliance 100%, AHI 1.8/ hr Being Rx'd for HBP Comfortable with CPAP but notes airleak at connection to hose  ROS-see HPI + = positive Constitutional:   No-   weight loss, night sweats, fevers, chills, fatigue, lassitude. HEENT:   No-  headaches, difficulty swallowing, tooth/dental problems, sore throat,       No- sneezing, itching,  No-ear ache, +nasal congestion, post nasal drip,  CV:  No-   chest pain, orthopnea, PND, swelling in lower extremities, anasarca,  dizziness, palpitations Resp: No-   shortness of breath with exertion or at rest.              No-   productive cough,  No non-productive cough,  No- coughing up of blood.              No-   change in color of mucus.  No- wheezing.   Skin: No-   rash or lesions. GI:  No-   heartburn, indigestion, abdominal pain, nausea, vomiting GU:  MS:  No-   joint pain or swelling.   Neuro-     nothing unusual Psych:  No- change in mood  or affect. No depression or anxiety.  No memory loss.  OBJ- Physical Exam    General- Alert, Oriented, Affect-appropriate, Distress- none acute Skin- rash-none, lesions- none, excoriation- none Lymphadenopathy- none Head- atraumatic            Eyes- Gross vision intact, PERRLA, conjunctivae and secretions clear            Ears- +Hearing aid left ear.             Nose- Clear, no-Septal dev, mucus, polyps, erosion, perforation             Throat- Mallampati III-IV , mucosa clear , drainage- none, tonsils- atrophic Neck- flexible , trachea midline, no stridor , thyroid nl, carotid no bruit Chest - symmetrical excursion , unlabored           Heart/CV- RRR , no murmur , no gallop  , no rub, nl s1 s2                           - JVD- none , edema- none, stasis changes- none, varices- none           Lung- clear to P&A, wheeze- none, cough- none , dullness-none, rub- none           Chest wall-  Abd-  Br/ Gen/ Rectal- Not done, not indicated Extrem- cyanosis- none, clubbing, none, atrophy- none, strength- nl Neuro- grossly intact to observation

## 2019-02-20 NOTE — Assessment & Plan Note (Signed)
Now on HCTZ, managed by PCP

## 2019-02-20 NOTE — Patient Instructions (Signed)
Order- DME [facility]- please refit leaking mask. Continue autopap 12-20, mask of choice, humidifier, supplies, Airview/ card  Order- flu vax- standard  Please cal if we can help

## 2019-02-20 NOTE — Assessment & Plan Note (Signed)
So far without recurrence

## 2019-02-20 NOTE — Assessment & Plan Note (Signed)
Benefits with good compliance and control, but notes airleak at connector. Not a mask fit issue. Plan continue auto 12-20, discuss mask with DME

## 2019-03-12 IMAGING — CT CT PARANASAL SINUSES LIMITED
1 of 2 series · 8 of 11 positions shown, 10 images · non-contrast
Comparison: None.

CLINICAL DATA: Sinusitis suggested by dental radiographs.

EXAM:
CT PARANASAL SINUS LIMITED WITHOUT CONTRAST
TECHNIQUE: Non-contiguous multidetector CT images of the paranasal sinuses were
obtained in a single plane without contrast.

[Series 4: limited sinus st · axial · 0.25mm/px · z∈[+149,+219]mm · 8 of 10 slices shown, 10 images]
[im 2/10  brain]
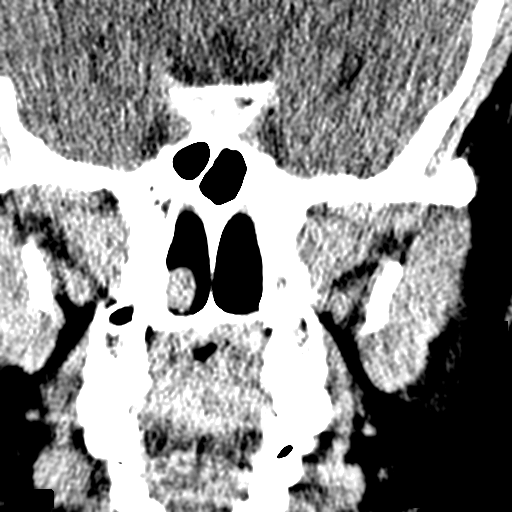
[im 2/10  bone]
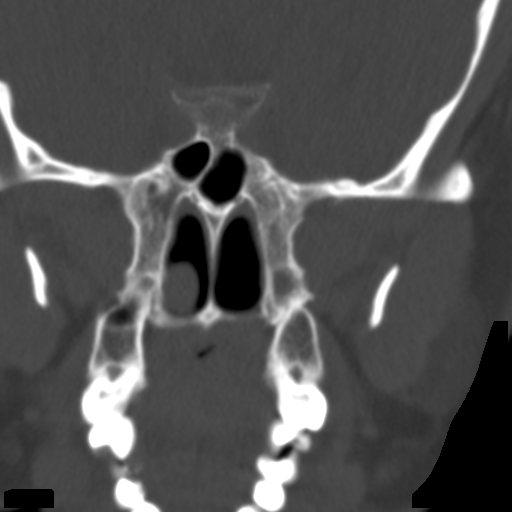
[im 3/10  bone]
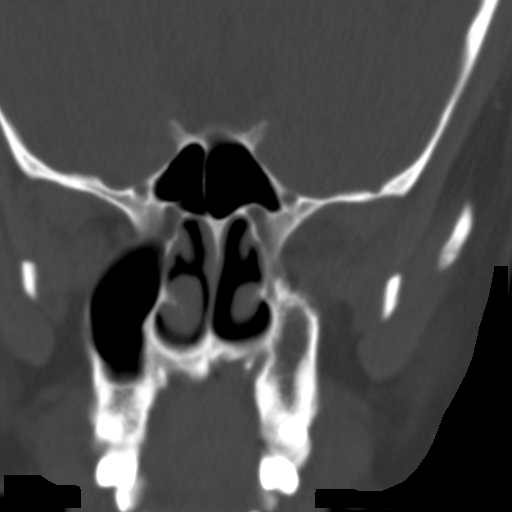
[im 4/10  bone]
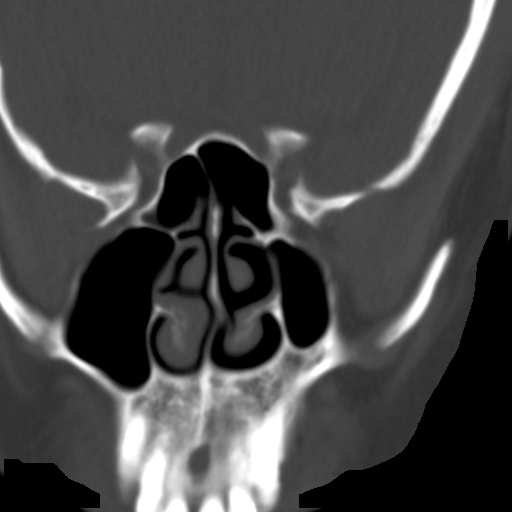
[im 5/10  bone]
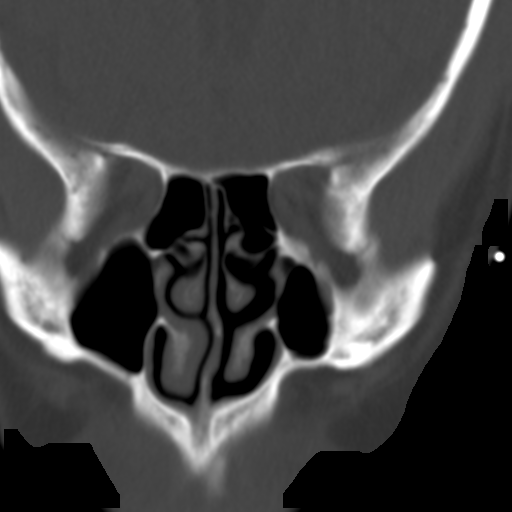
[im 6/10  brain]
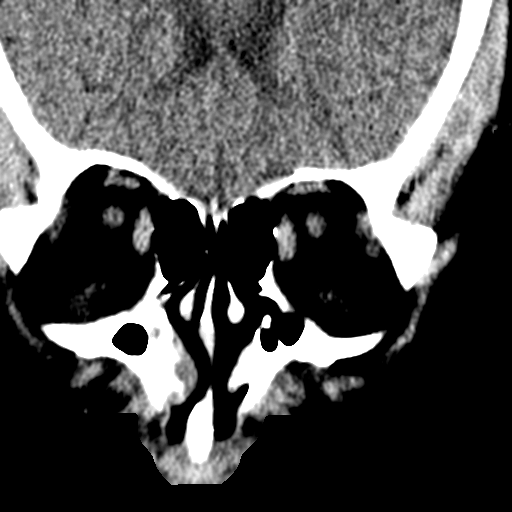
[im 6/10  bone]
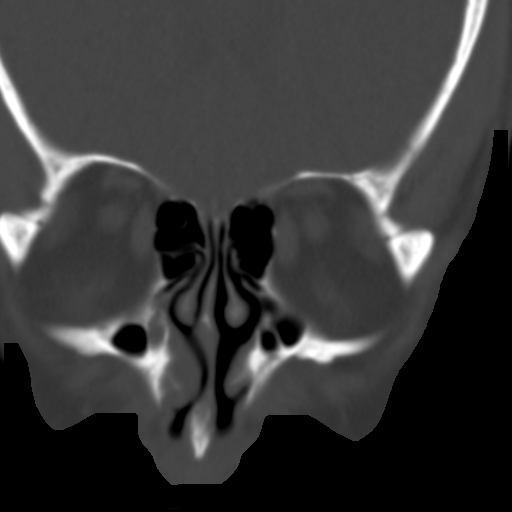
[im 7/10  bone]
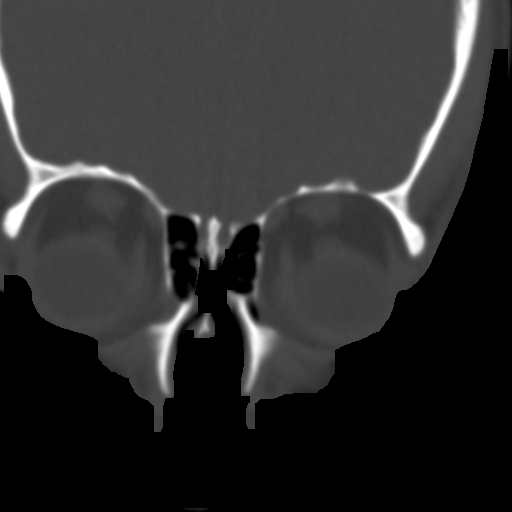
[im 8/10  bone]
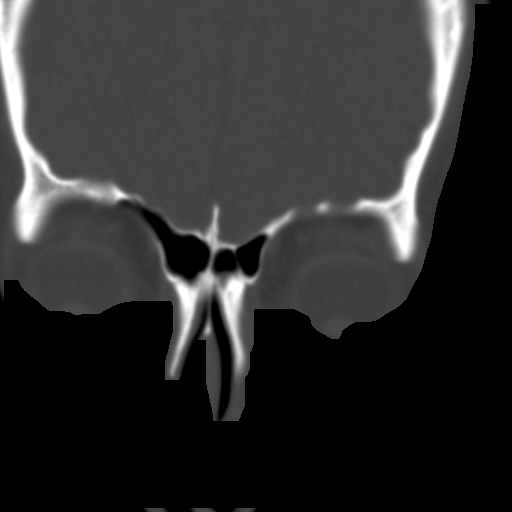
[im 9/10  bone]
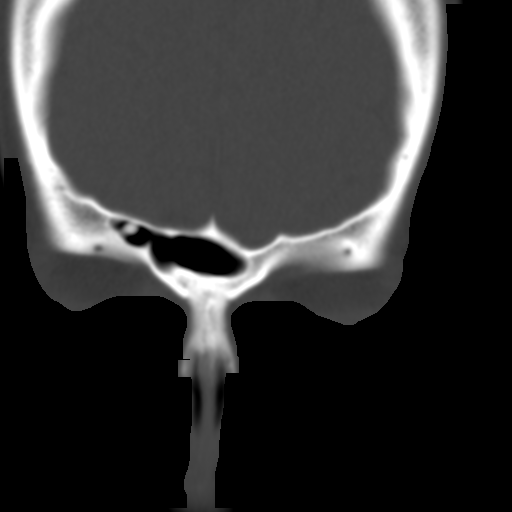

[8 of 11 positions shown; findings below may reference images not displayed]

FINDINGS: There is a tiny osteoma in the left frontal sinus, measuring 4 mm.
The paranasal sinuses are all clear without fluid levels, mucosal
thickening or other abnormality.
IMPRESSION: Clear paranasal sinuses.

## 2020-02-18 ENCOUNTER — Encounter: Payer: Self-pay | Admitting: Internal Medicine

## 2020-02-19 NOTE — Progress Notes (Signed)
HPI male never smoker followed for OSA, allergic rhinitis, complicated by GERD, prostate cancer, NPSG 10/04/88- AHI 74.1/ hr, desaturation to 67% ------------------------------------------------------------------------------------  02/20/2019- 59 year old male never smoker followed for OSA, allergic rhinitis, complicated by GERD, prostate cancer/ radical resection, -----OSA on CPAP, DME: [facility]; no complaints other than mask leak  CPAP auto 12-20/ [facility] Body weight today 234 lbs Download compliance 100%, AHI 1.8/ hr Being Rx'd for HBP Comfortable with CPAP but notes airleak at connection to hose  02/20/20- 59 year old male never smoker followed for OSA, allergic Rhinitis, Sinusitis complicated by GERD, prostate cancer/ radical resection, HTN, GERD, Hyperlipidemia,  CPAP auto 12-20/ [facility]    Pressure is ranging 15.5-19.9 Download- compliance 100%, AHI 1.9/ hr Body weight today- 239 lbs Covid vax- 2 Phizer Flu vax- Had Pressure is at top of machine range, but with good control and he is comfortable. If he were to gain weight might need to consider BIPAP, as discussed.  Occasional insomnia- can take Tylenol PM if needed, but minimizes.  Prostate cancer w/o recurrence so far. He denies other health concerns.   ROS-see HPI + = positive Constitutional:   No-   weight loss, night sweats, fevers, chills, fatigue, lassitude. HEENT:   No-  headaches, difficulty swallowing, tooth/dental problems, sore throat,       No- sneezing, itching,  No-ear ache, +nasal congestion, post nasal drip,  CV:  No-   chest pain, orthopnea, PND, swelling in lower extremities, anasarca,  dizziness, palpitations Resp: No-   shortness of breath with exertion or at rest.              No-   productive cough,  No non-productive cough,  No- coughing up of blood.              No-   change in color of mucus.  No- wheezing.   Skin: No-   rash or lesions. GI:  No-   heartburn, indigestion, abdominal pain, nausea, vomiting GU:   MS:  No-   joint pain or swelling.   Neuro-     nothing unusual Psych:  No- change in mood or affect. No depression or anxiety.  No memory loss.  OBJ- Physical Exam    General- Alert, Oriented, Affect-appropriate, Distress- none acute Skin- rash-none, lesions- none, excoriation- none Lymphadenopathy- none Head- atraumatic            Eyes- Gross vision intact, PERRLA, conjunctivae and secretions clear            Ears- +Hearing aid left ear.             Nose- Clear, no-Septal dev, mucus, polyps, erosion, perforation             Throat- Mallampati III-IV , mucosa clear , drainage- none, tonsils- atrophic Neck- flexible , trachea midline, no stridor , thyroid nl, carotid no bruit Chest - symmetrical excursion , unlabored           Heart/CV- RRR , no murmur , no gallop  , no rub, nl s1 s2                           - JVD- none , edema- none, stasis changes- none, varices- none           Lung- clear to P&A, wheeze- none, cough- none , dullness-none, rub- none           Chest wall-  Abd-  Br/ Gen/ Rectal- Not done,  not indicated Extrem- cyanosis- none, clubbing, none, atrophy- none, strength- nl Neuro- grossly intact to observation

## 2020-02-20 ENCOUNTER — Ambulatory Visit (INDEPENDENT_AMBULATORY_CARE_PROVIDER_SITE_OTHER): Payer: Commercial Managed Care - PPO | Admitting: Internal Medicine

## 2020-02-20 ENCOUNTER — Other Ambulatory Visit: Payer: Self-pay

## 2020-02-20 ENCOUNTER — Encounter: Payer: Self-pay | Admitting: Internal Medicine

## 2020-02-20 DIAGNOSIS — J3089 Other allergic rhinitis: Secondary | ICD-10-CM | POA: Diagnosis not present

## 2020-02-20 DIAGNOSIS — J302 Other seasonal allergic rhinitis: Secondary | ICD-10-CM | POA: Diagnosis not present

## 2020-02-20 DIAGNOSIS — G4733 Obstructive sleep apnea (adult) (pediatric): Secondary | ICD-10-CM

## 2020-02-20 NOTE — Assessment & Plan Note (Signed)
Benefits from CPAP with good compliance and control Watching pressure range of his machine. Plan- Continue auto 12-20

## 2020-02-20 NOTE — Patient Instructions (Signed)
We can continue CPAP auto 12-20  Please call if we can help

## 2020-02-20 NOTE — Assessment & Plan Note (Signed)
Currently adequate control Plan- otc if needed

## 2021-02-18 HISTORY — PX: MASS EXCISION: SHX2000

## 2021-02-19 ENCOUNTER — Ambulatory Visit: Payer: Commercial Managed Care - PPO | Admitting: Internal Medicine

## 2021-03-25 ENCOUNTER — Encounter: Payer: Self-pay | Admitting: Internal Medicine

## 2021-03-26 NOTE — Progress Notes (Signed)
HPI male never smoker followed for OSA, allergic rhinitis, complicated by GERD, prostate cancer, NPSG 10/04/88- AHI 74.1/ hr, desaturation to 67% ------------------------------------------------------------------------------------   02/20/20- 60 year old male never smoker followed for OSA, allergic Rhinitis, Sinusitis complicated by GERD, prostate cancer/ radical resection, HTN, GERD, Hyperlipidemia,  CPAP auto 12-20/ [facility]    Pressure is ranging 15.5-19.9 Download- compliance 100%, AHI 1.9/ hr Body weight today- 239 lbs Covid vax- 2 Phizer Flu vax- Had Pressure is at top of machine range, but with good control and he is comfortable. If he were to gain weight might need to consider BIPAP, as discussed.  Occasional insomnia- can take Tylenol PM if needed, but minimizes.  Prostate cancer w/o recurrence so far. He denies other health concerns.   03/29/21-60 year old male never smoker followed for OSA, allergic Rhinitis, Sinusitis complicated by GERD, prostate cancer/ radical resection, HTN, GERD, Hyperlipidemia,  CPAP auto 12-20/ [facility]    Pressure is ranging 15.5- 19.9 Download- compliance 100%. AHI 1.6/ hr Body weight today- 243 lbs Covid vax- 3 Phizer Flu vax- had He has been very comfortable with CPAP.  We discussed options including [facility], about which he had questions.  He will be eligible for replacement of his CPAP machine in another year or so but can call the homecare company for a definite date.  He denies interval concerns or problems.  ROS-see HPI + = positive Constitutional:   No-   weight loss, night sweats, fevers, chills, fatigue, lassitude. HEENT:   No-  headaches, difficulty swallowing, tooth/dental problems, sore throat,       No- sneezing, itching,  No-ear ache, +nasal congestion, post nasal drip,  CV:  No-   chest pain, orthopnea, PND, swelling in lower extremities, anasarca,  dizziness, palpitations Resp: No-   shortness of breath with exertion or at rest.               No-   productive cough,  No non-productive cough,  No- coughing up of blood.              No-   change in color of mucus.  No- wheezing.   Skin: No-   rash or lesions. GI:  No-   heartburn, indigestion, abdominal pain, nausea, vomiting GU:  MS:  No-   joint pain or swelling.   Neuro-     nothing unusual Psych:  No- change in mood or affect. No depression or anxiety.  No memory loss.  OBJ- Physical Exam    General- Alert, Oriented, Affect-appropriate, Distress- none acute, + overweight Skin- rash-none, lesions- none, excoriation- none Lymphadenopathy- none Head- atraumatic            Eyes- Gross vision intact, PERRLA, conjunctivae and secretions clear            Ears- +Hearing aid left ear.             Nose- Clear, no-Septal dev, mucus, polyps, erosion, perforation             Throat- Mallampati III-IV , mucosa clear , drainage- none, tonsils- atrophic Neck- flexible , trachea midline, no stridor , thyroid nl, carotid no bruit Chest - symmetrical excursion , unlabored           Heart/CV- RRR , no murmur , no gallop  , no rub, nl s1 s2                           - JVD- none , edema- none,  stasis changes- none, varices- none           Lung- clear to P&A, wheeze- none, cough- none , dullness-none, rub- none           Chest wall-  Abd-  Br/ Gen/ Rectal- Not done, not indicated Extrem- cyanosis- none, clubbing, none, atrophy- none, strength- nl Neuro- grossly intact to observation

## 2021-03-29 ENCOUNTER — Encounter: Payer: Self-pay | Admitting: Internal Medicine

## 2021-03-29 ENCOUNTER — Other Ambulatory Visit: Payer: Self-pay

## 2021-03-29 ENCOUNTER — Ambulatory Visit (INDEPENDENT_AMBULATORY_CARE_PROVIDER_SITE_OTHER): Payer: Commercial Managed Care - PPO | Admitting: Internal Medicine

## 2021-03-29 DIAGNOSIS — G4733 Obstructive sleep apnea (adult) (pediatric): Secondary | ICD-10-CM

## 2021-03-29 DIAGNOSIS — E663 Overweight: Secondary | ICD-10-CM

## 2021-03-29 NOTE — Patient Instructions (Signed)
We can continue CPAP auto 12-20  If you decide that you do want to talk with someone about [facility] or other treatment options, please let me know.

## 2021-03-29 NOTE — Assessment & Plan Note (Signed)
He continues to benefit from CPAP and we will leave the pressure range at 12-20.

## 2021-03-29 NOTE — Assessment & Plan Note (Signed)
Encourage diet and exercise to avoid further weight gain

## 2021-11-17 ENCOUNTER — Ambulatory Visit (INDEPENDENT_AMBULATORY_CARE_PROVIDER_SITE_OTHER): Payer: Commercial Managed Care - PPO

## 2021-11-17 ENCOUNTER — Ambulatory Visit (INDEPENDENT_AMBULATORY_CARE_PROVIDER_SITE_OTHER): Payer: Commercial Managed Care - PPO | Admitting: Orthopaedic Surgery

## 2021-11-17 DIAGNOSIS — M25551 Pain in right hip: Secondary | ICD-10-CM | POA: Diagnosis not present

## 2021-11-17 DIAGNOSIS — M1611 Unilateral primary osteoarthritis, right hip: Secondary | ICD-10-CM | POA: Diagnosis not present

## 2021-11-17 NOTE — Progress Notes (Signed)
Office Visit Note   Patient: Douglas Grimes           Date of Birth: 1960/10/28           MRN: [id number] Visit Date: 11/17/2021              Requested by: [name], MD [address] [place],  [place] [address] PCP: [name], MD   Assessment & Plan: Visit Diagnoses:  1. Pain in right hip   2. Unilateral primary osteoarthritis, right hip     Plan: I went over his x-rays with him as well as his clinical exam findings.  We talked about hip replacement surgery in detail.  I described the surgery involves from an intraoperative and postoperative standpoint.  We described the risk and benefits of surgery in detail.  I did give him a handout about hip replacement surgery as well.  All questions and concerns were answered and addressed.  We will work on getting this scheduled.  He agrees as well.  Follow-Up Instructions: Return for 2 weeks post-op.   Orders:  Orders Placed This Encounter  Procedures   XR HIP UNILAT W OR W/O PELVIS 1V RIGHT   No orders of the defined types were placed in this encounter.     Procedures: No procedures performed   Clinical Data: No additional findings.   Subjective: Chief Complaint  Patient presents with   Right Hip - Pain  The patient is a very pleasant 61 year old gentleman who comes to me today to discuss hip replacement surgery.  He is active been followed for his right hip for several years now by different orthopedic specialist.  At this point his right hip pain is daily and it is 10 out of 10.  It is definitely affecting his mobility, his quality of life, and his actives daily living.  It hurts in the groin on the right side.  He has tried and failed all forms of conservative treatment for well over 12 months.  He has had injections in the right hip with a steroid as well as work on activity modification and strengthening exercises.  He is on Celebrex regularly as regards to the pain.  He is not a diabetic and not  on blood thinning medications.  At this point he is interested in hip replacement surgery.  He has family members that work in physical therapy and I recommended him seeing me and considering direct anterior hip surgery.  HPI  Review of Systems  There is currently no headache, chest pain, shortness of breath, fever, chills, nausea, vomiting  Objective: Vital Signs: There were no vitals taken for this visit.  Physical Exam He is alert and orient x3 and in no acute distress Ortho Exam Examination of his right hip shows significant stiffness and pain with internal and external rotation.  His left hip moves smoothly and fluidly.  He does walk with a limp to his gait which is significant. Specialty Comments:  No specialty comments available.  Imaging: XR HIP UNILAT W OR W/O PELVIS 1V RIGHT  Result Date: 11/17/2021 An AP pelvis and lateral right hip shows severe end-stage arthritis of the right hip with significant joint space narrowing, periarticular osteophytes and sclerotic changes in the hip joint in terms of the femoral head and acetabulum.  The left hip only shows mild arthritic changes.    PMFS History: Patient Active Problem List   Diagnosis Date Noted   Unilateral primary osteoarthritis, right  hip 11/17/2021   Overweight 03/29/2021   Hypertension, essential 02/20/2019   Prostate cancer ([place]) 02/20/2019   Maxillary sinusitis, chronic 02/26/2017   Blood in right ear canal 07/01/2015   HYPERLIPIDEMIA 07/03/2007   Obstructive sleep apnea 07/02/2007   Seasonal and perennial allergic rhinitis 07/02/2007   Past Medical History:  Diagnosis Date   Allergic rhinitis    GERD (gastroesophageal reflux disease)    Hyperlipidemia    MRSA (methicillin resistant Staphylococcus aureus)    MRSA infection    right leg-posterior are behind knee   Sleep apnea    NPSG 1990 AHI 74.1    Family History  Problem Relation Age of Onset   Sleep apnea Brother    Sleep apnea Father    Cancer  Father        urinary bladder cancer    Past Surgical History:  Procedure Laterality Date   COLONOSCOPY N/A 08/12/2013   Procedure: COLONOSCOPY;  Surgeon: Juanita Craver, MD;  Location: [facility];  Service: Endoscopy;  Laterality: N/A;   Social History   Occupational History   Occupation: Programme researcher, broadcasting/film/video  Tobacco Use   Smoking status: Never   Smokeless tobacco: Never  Substance and Sexual Activity   Alcohol use: Yes    Comment: occassionally   Drug use: No   Sexual activity: Not on file

## 2021-12-07 ENCOUNTER — Other Ambulatory Visit: Payer: Self-pay

## 2021-12-07 ENCOUNTER — Other Ambulatory Visit: Payer: Self-pay | Admitting: Physician Assistant

## 2021-12-07 DIAGNOSIS — M1611 Unilateral primary osteoarthritis, right hip: Secondary | ICD-10-CM

## 2021-12-09 NOTE — Patient Instructions (Signed)
DUE TO COVID-19 ONLY TWO VISITORS  (aged 62 and older)  ARE ALLOWED TO COME WITH YOU AND STAY IN THE WAITING ROOM ONLY DURING PRE OP AND PROCEDURE.   **NO VISITORS ARE ALLOWED IN THE SHORT STAY AREA OR RECOVERY ROOM!!**  IF YOU WILL BE ADMITTED INTO THE HOSPITAL YOU ARE ALLOWED ONLY FOUR SUPPORT PEOPLE DURING VISITATION HOURS ONLY (7 AM -8PM)   The support person(s) must pass our screening, gel in and out, and wear a mask at all times, including in the patient's room. Patients must also wear a mask when staff or their support person are in the room. Visitors GUEST BADGE MUST BE WORN VISIBLY  One adult visitor may remain with you overnight and MUST be in the room by 8 P.M.     Your procedure is scheduled on: 12/17/21   Report to [facility] Main Entrance    Report to admitting at : 11:00 AM   Call this number if you have problems the morning of surgery [phone]   Do not eat food :After Midnight.   After Midnight you may have the following liquids until: 10:30 AM DAY OF SURGERY  Water Black Coffee (sugar ok, NO MILK/CREAM OR CREAMERS)  Tea (sugar ok, NO MILK/CREAM OR CREAMERS) regular and decaf                             Plain Jell-O (NO RED)                                           Fruit ices (not with fruit pulp, NO RED)                                     Popsicles (NO RED)                                                                  Juice: apple, WHITE grape, WHITE cranberry Sports drinks like Gatorade (NO RED)              Drink  Ensure drink AT :10:30 AM the day of surgery.     The day of surgery:  Drink ONE (1) Pre-Surgery Clear Ensure or G2 at AM the morning of surgery. Drink in one sitting. Do not sip.  This drink was given to you during your hospital  pre-op appointment visit. Nothing else to drink after completing the  Pre-Surgery Clear Ensure or G2.          If you have questions, please contact your surgeon's office.   Oral Hygiene is also  important to reduce your risk of infection.                                    Remember - BRUSH YOUR TEETH THE MORNING OF SURGERY WITH YOUR REGULAR TOOTHPASTE   Do NOT smoke after Midnight   Take these medicines the morning of surgery with A SIP OF  WATER: Tylenol as needed.  DO NOT TAKE ANY ORAL DIABETIC MEDICATIONS DAY OF YOUR SURGERY  Bring CPAP mask and tubing day of surgery.                              You may not have any metal on your body including hair pins, jewelry, and body piercing             Do not wear lotions, powders, perfumes/cologne, or deodorant              Men may shave face and neck.   Do not bring valuables to the hospital. [place].   Contacts, dentures or bridgework may not be worn into surgery.   Bring small overnight bag day of surgery.   DO NOT [place]. PHARMACY WILL DISPENSE MEDICATIONS LISTED ON YOUR MEDICATION LIST TO YOU DURING YOUR ADMISSION [place]!    Patients discharged on the day of surgery will not be allowed to drive home.  Someone NEEDS to stay with you for the first 24 hours after anesthesia.   Special Instructions: Bring a copy of your healthcare power of attorney and living will documents         the day of surgery if you haven't scanned them before.              Please read over the following fact sheets you were given: IF YOU HAVE QUESTIONS ABOUT YOUR PRE-OP INSTRUCTIONS PLEASE CALL [phone]     [facility] - Preparing for Surgery Before surgery, you can play an important role.  Because skin is not sterile, your skin needs to be as free of germs as possible.  You can reduce the number of germs on your skin by washing with CHG (chlorahexidine gluconate) soap before surgery.  CHG is an antiseptic cleaner which kills germs and bonds with the skin to continue killing germs even after washing. Please DO NOT use if you have an allergy to CHG or  antibacterial soaps.  If your skin becomes reddened/irritated stop using the CHG and inform your nurse when you arrive at Short Stay. Do not shave (including legs and underarms) for at least 48 hours prior to the first CHG shower.  You may shave your face/neck. Please follow these instructions carefully:  1.  Shower with CHG Soap the night before surgery and the  morning of Surgery.  2.  If you choose to wash your hair, wash your hair first as usual with your  normal  shampoo.  3.  After you shampoo, rinse your hair and body thoroughly to remove the  shampoo.                           4.  Use CHG as you would any other liquid soap.  You can apply chg directly  to the skin and wash                       Gently with a scrungie or clean washcloth.  5.  Apply the CHG Soap to your body ONLY FROM THE NECK DOWN.   Do not use on face/ open  Wound or open sores. Avoid contact with eyes, ears mouth and genitals (private parts).                       Wash face,  Genitals (private parts) with your normal soap.             6.  Wash thoroughly, paying special attention to the area where your surgery  will be performed.  7.  Thoroughly rinse your body with warm water from the neck down.  8.  DO NOT shower/wash with your normal soap after using and rinsing off  the CHG Soap.                9.  Pat yourself dry with a clean towel.            10.  Wear clean pajamas.            11.  Place clean sheets on your bed the night of your first shower and do not  sleep with pets. Day of Surgery : Do not apply any lotions/deodorants the morning of surgery.  Please wear clean clothes to the hospital/surgery center.  FAILURE TO FOLLOW THESE INSTRUCTIONS MAY RESULT IN THE CANCELLATION OF YOUR SURGERY PATIENT SIGNATURE_________________________________  NURSE SIGNATURE__________________________________  ________________________________________________________________________   Adam Phenix  An incentive spirometer is a tool that can help keep your lungs clear and active. This tool measures how well you are filling your lungs with each breath. Taking long deep breaths may help reverse or decrease the chance of developing breathing (pulmonary) problems (especially infection) following: A long period of time when you are unable to move or be active. BEFORE THE PROCEDURE  If the spirometer includes an indicator to show your best effort, your nurse or respiratory therapist will set it to a desired goal. If possible, sit up straight or lean slightly forward. Try not to slouch. Hold the incentive spirometer in an upright position. INSTRUCTIONS FOR USE  Sit on the edge of your bed if possible, or sit up as far as you can in bed or on a chair. Hold the incentive spirometer in an upright position. Breathe out normally. Place the mouthpiece in your mouth and seal your lips tightly around it. Breathe in slowly and as deeply as possible, raising the piston or the ball toward the top of the column. Hold your breath for 3-5 seconds or for as long as possible. Allow the piston or ball to fall to the bottom of the column. Remove the mouthpiece from your mouth and breathe out normally. Rest for a few seconds and repeat Steps 1 through 7 at least 10 times every 1-2 hours when you are awake. Take your time and take a few normal breaths between deep breaths. The spirometer may include an indicator to show your best effort. Use the indicator as a goal to work toward during each repetition. After each set of 10 deep breaths, practice coughing to be sure your lungs are clear. If you have an incision (the cut made at the time of surgery), support your incision when coughing by placing a pillow or rolled up towels firmly against it. Once you are able to get out of bed, walk around indoors and cough well. You may stop using the incentive spirometer when instructed by your caregiver.  RISKS AND  COMPLICATIONS Take your time so you do not get dizzy or light-headed. If you are in pain, you may need to take or  ask for pain medication before doing incentive spirometry. It is harder to take a deep breath if you are having pain. AFTER USE Rest and breathe slowly and easily. It can be helpful to keep track of a log of your progress. Your caregiver can provide you with a simple table to help with this. If you are using the spirometer at home, follow these instructions: [place] IF:  You are having difficultly using the spirometer. You have trouble using the spirometer as often as instructed. Your pain medication is not giving enough relief while using the spirometer. You develop fever of 100.5 F (38.1 C) or higher. SEEK IMMEDIATE MEDICAL CARE IF:  You cough up bloody sputum that had not been present before. You develop fever of 102 F (38.9 C) or greater. You develop worsening pain at or near the incision site. MAKE SURE YOU:  Understand these instructions. Will watch your condition. Will get help right away if you are not doing well or get worse. Document Released: 08/22/2006 Document Revised: 07/04/2011 Document Reviewed: 10/23/2006 [facility] Patient Information 2014 [place], [place].   ________________________________________________________________________

## 2021-12-10 ENCOUNTER — Other Ambulatory Visit: Payer: Self-pay

## 2021-12-10 ENCOUNTER — Encounter (HOSPITAL_COMMUNITY): Payer: Self-pay

## 2021-12-10 ENCOUNTER — Encounter (HOSPITAL_COMMUNITY)
Admission: RE | Admit: 2021-12-10 | Discharge: 2021-12-10 | Disposition: A | Discharge status: Home / Self Care | Payer: Commercial Managed Care - PPO | Source: Ambulatory Visit | Attending: Orthopaedic Surgery | Admitting: Orthopaedic Surgery

## 2021-12-10 VITALS — BP 141/90 | HR 72 | Temp 98.2°F | Ht 72.0 in | Wt 230.0 lb

## 2021-12-10 DIAGNOSIS — I10 Essential (primary) hypertension: Secondary | ICD-10-CM | POA: Diagnosis not present

## 2021-12-10 DIAGNOSIS — M1611 Unilateral primary osteoarthritis, right hip: Secondary | ICD-10-CM | POA: Insufficient documentation

## 2021-12-10 DIAGNOSIS — Z01818 Encounter for other preprocedural examination: Secondary | ICD-10-CM | POA: Insufficient documentation

## 2021-12-10 HISTORY — DX: Prediabetes: R73.03

## 2021-12-10 HISTORY — DX: Unspecified osteoarthritis, unspecified site: M19.90

## 2021-12-10 HISTORY — DX: Essential (primary) hypertension: I10

## 2021-12-10 HISTORY — DX: Malignant (primary) neoplasm, unspecified: C80.1

## 2021-12-10 LAB — SURGICAL PCR SCREEN
MRSA, PCR: NEGATIVE
Staphylococcus aureus: NEGATIVE

## 2021-12-10 LAB — GLUCOSE, CAPILLARY: Glucose-Capillary: 103 mg/dL — ABNORMAL HIGH (ref 70–99)

## 2021-12-10 NOTE — Progress Notes (Signed)
For Short Stay: [place] appointment date: Date of COVID positive in last 42 days:  Bowel Prep reminder:   For Anesthesia: PCP - Dr. Amil Amen Cardiologist -   Chest x-ray -  EKG -  Stress Test -  ECHO -  Cardiac Cath -  Pacemaker/ICD device last checked: Pacemaker orders received: Device Rep notified:  Spinal Cord Stimulator:  Sleep Study - Yes CPAP - Yes  Fasting Blood Sugar -  Checks Blood Sugar _____ times a day Date and result of last Hgb A1c- 5.8: 12/08/21  Blood Thinner Instructions: Aspirin Instructions: Last Dose:  Activity level: Can go up a flight of stairs and activities of daily living without stopping and without chest pain and/or shortness of breath   Able to exercise without chest pain and/or shortness of breath   Unable to go up a flight of stairs without chest pain and/or shortness of breath     Anesthesia review: Hx: HTN,OSA(CPAP),Pre-DIA  Patient denies shortness of breath, fever, cough and chest pain at PAT appointment   Patient verbalized understanding of instructions that were given to them at the PAT appointment. Patient was also instructed that they will need to review over the PAT instructions again at home before surgery.

## 2021-12-16 NOTE — H&P (Signed)
TOTAL HIP ADMISSION H&P  Patient is admitted for right total hip arthroplasty.  Subjective:  Chief Complaint: right hip pain  HPI: Douglas Grimes, 61 y.o. male, has a history of pain and functional disability in the right hip(s) due to arthritis and patient has failed non-surgical conservative treatments for greater than 12 weeks to include NSAID's and/or analgesics, corticosteriod injections, flexibility and strengthening excercises, weight reduction as appropriate, and activity modification.  Onset of symptoms was gradual starting 1 years ago with gradually worsening course since that time.The patient noted no past surgery on the right hip(s).  Patient currently rates pain in the right hip at 10 out of 10 with activity. Patient has night pain, worsening of pain with activity and weight bearing, pain that interfers with activities of daily living, and pain with passive range of motion. Patient has evidence of subchondral cysts, subchondral sclerosis, periarticular osteophytes, and joint space narrowing by imaging studies. This condition presents safety issues increasing the risk of falls.  There is no current active infection.  Patient Active Problem List   Diagnosis Date Noted   Unilateral primary osteoarthritis, right hip 11/17/2021   Overweight 03/29/2021   Hypertension, essential 02/20/2019   Prostate cancer ([place]) 02/20/2019   Maxillary sinusitis, chronic 02/26/2017   Blood in right ear canal 07/01/2015   HYPERLIPIDEMIA 07/03/2007   Obstructive sleep apnea 07/02/2007   Seasonal and perennial allergic rhinitis 07/02/2007   Past Medical History:  Diagnosis Date   Allergic rhinitis    Arthritis    Cancer (HCC)    GERD (gastroesophageal reflux disease)    Hyperlipidemia    Hypertension    MRSA (methicillin resistant Staphylococcus aureus)    MRSA infection    right leg-posterior are behind knee   Pre-diabetes    Sleep apnea    NPSG 1990 AHI 74.1    Past Surgical History:   Procedure Laterality Date   COLONOSCOPY N/A 08/12/2013   Procedure: COLONOSCOPY;  Surgeon: Juanita Craver, MD;  Location: [facility] ENDOSCOPY;  Service: Endoscopy;  Laterality: N/A;   MASS EXCISION  02/18/2021   PROSTATECTOMY  06/2016    No current facility-administered medications for this encounter.   Current Outpatient Medications  Medication Sig Dispense Refill Last Dose   acetaminophen (TYLENOL) 325 MG tablet Take 650 mg by mouth every 6 (six) hours as needed for moderate pain.      atorvastatin (LIPITOR) 40 MG tablet Take 40 mg by mouth daily.      celecoxib (CELEBREX) 100 MG capsule Take 100 mg by mouth 2 (two) times daily.      hydrochlorothiazide (MICROZIDE) 12.5 MG capsule Take 12.5 mg by mouth daily.      losartan (COZAAR) 25 MG tablet Take by mouth.      Allergies  Allergen Reactions   Iodine Rash   Sulfonamide Derivatives Rash   Iodinated Contrast Media Rash   Hydrocodone Nausea And Vomiting   Nasacort [Triamcinolone]     Severe headaches   Tramadol Rash    Social History   Tobacco Use   Smoking status: Never   Smokeless tobacco: Never  Substance Use Topics   Alcohol use: Yes    Comment: occassionally    Family History  Problem Relation Age of Onset   Sleep apnea Brother    Sleep apnea Father    Cancer Father        urinary bladder cancer     Review of Systems  Musculoskeletal:  Positive for gait problem.  All other systems reviewed  and are negative.   Objective:  Physical Exam Vitals reviewed.  Constitutional:      Appearance: Normal appearance.  HENT:     Head: Normocephalic and atraumatic.  Eyes:     Extraocular Movements: Extraocular movements intact.     Pupils: Pupils are equal, round, and reactive to light.  Cardiovascular:     Rate and Rhythm: Normal rate and regular rhythm.     Pulses: Normal pulses.  Pulmonary:     Effort: Pulmonary effort is normal.     Breath sounds: Normal breath sounds.  Abdominal:     Palpations: Abdomen is soft.   Musculoskeletal:     Cervical back: Normal range of motion and neck supple.     Right hip: Tenderness and bony tenderness present. Decreased range of motion. Decreased strength.  Neurological:     Mental Status: He is alert and oriented to person, place, and time.  Psychiatric:        Behavior: Behavior normal.     Vital signs in last 24 hours:    Labs:   Estimated body mass index is 31.19 kg/m as calculated from the following:   Height as of 12/10/21: 6' (1.829 m).   Weight as of 12/10/21: 104.3 kg.   Imaging Review Plain radiographs demonstrate severe degenerative joint disease of the right hip(s). The bone quality appears to be excellent for age and reported activity level.      Assessment/Plan:  End stage arthritis, right hip(s)  The patient history, physical examination, clinical judgement of the provider and imaging studies are consistent with end stage degenerative joint disease of the right hip(s) and total hip arthroplasty is deemed medically necessary. The treatment options including medical management, injection therapy, arthroscopy and arthroplasty were discussed at length. The risks and benefits of total hip arthroplasty were presented and reviewed. The risks due to aseptic loosening, infection, stiffness, dislocation/subluxation,  thromboembolic complications and other imponderables were discussed.  The patient acknowledged the explanation, agreed to proceed with the plan and consent was signed. Patient is being admitted for inpatient treatment for surgery, pain control, PT, OT, prophylactic antibiotics, VTE prophylaxis, progressive ambulation and ADL's and discharge planning.The patient is planning to be discharged home with home health services

## 2021-12-17 ENCOUNTER — Encounter (HOSPITAL_COMMUNITY)
Admission: RE | Disposition: A | Discharge status: Home / Self Care | Payer: Self-pay | Source: Home / Self Care | Attending: Orthopaedic Surgery

## 2021-12-17 ENCOUNTER — Ambulatory Visit (HOSPITAL_COMMUNITY): Payer: Commercial Managed Care - PPO | Admitting: Certified Registered"

## 2021-12-17 ENCOUNTER — Observation Stay (HOSPITAL_COMMUNITY): Payer: Commercial Managed Care - PPO

## 2021-12-17 ENCOUNTER — Encounter (HOSPITAL_COMMUNITY): Payer: Self-pay | Admitting: Orthopaedic Surgery

## 2021-12-17 ENCOUNTER — Ambulatory Visit (HOSPITAL_COMMUNITY): Payer: Commercial Managed Care - PPO

## 2021-12-17 ENCOUNTER — Observation Stay (HOSPITAL_COMMUNITY)
Admission: RE | Admit: 2021-12-17 | Discharge: 2021-12-19 | Disposition: A | Discharge status: Home / Self Care | Payer: Commercial Managed Care - PPO | Attending: Orthopaedic Surgery | Admitting: Orthopaedic Surgery

## 2021-12-17 ENCOUNTER — Ambulatory Visit (HOSPITAL_BASED_OUTPATIENT_CLINIC_OR_DEPARTMENT_OTHER): Payer: Commercial Managed Care - PPO | Admitting: Certified Registered"

## 2021-12-17 ENCOUNTER — Other Ambulatory Visit: Payer: Self-pay

## 2021-12-17 DIAGNOSIS — Z79899 Other long term (current) drug therapy: Secondary | ICD-10-CM | POA: Insufficient documentation

## 2021-12-17 DIAGNOSIS — Z96641 Presence of right artificial hip joint: Secondary | ICD-10-CM

## 2021-12-17 DIAGNOSIS — I10 Essential (primary) hypertension: Secondary | ICD-10-CM | POA: Diagnosis not present

## 2021-12-17 DIAGNOSIS — Z8546 Personal history of malignant neoplasm of prostate: Secondary | ICD-10-CM | POA: Diagnosis not present

## 2021-12-17 DIAGNOSIS — M1611 Unilateral primary osteoarthritis, right hip: Secondary | ICD-10-CM

## 2021-12-17 HISTORY — PX: TOTAL HIP ARTHROPLASTY: SHX124

## 2021-12-17 LAB — ABO/RH: ABO/RH(D): A POS

## 2021-12-17 LAB — TYPE AND SCREEN
ABO/RH(D): A POS
Antibody Screen: NEGATIVE

## 2021-12-17 SURGERY — ARTHROPLASTY, HIP, TOTAL, ANTERIOR APPROACH
Anesthesia: Spinal | Site: Hip | Laterality: Right

## 2021-12-17 MED ORDER — MENTHOL 3 MG MT LOZG: 1.0000 | LOZENGE | OROMUCOSAL | Status: DC | PRN

## 2021-12-17 MED ORDER — FENTANYL CITRATE (PF) 100 MCG/2ML IJ SOLN
INTRAMUSCULAR | Status: DC | PRN
  Administered 2021-12-17: 50 ug via INTRAVENOUS

## 2021-12-17 MED ORDER — ASPIRIN 81 MG PO CHEW
81.0000 mg | CHEWABLE_TABLET | Freq: Two times a day (BID) | ORAL | Status: DC
  Administered 2021-12-17 – 2021-12-19 (×4): 81 mg via ORAL
  Filled 2021-12-17 (×4): qty 1

## 2021-12-17 MED ORDER — ONDANSETRON HCL 4 MG/2ML IJ SOLN
INTRAMUSCULAR | Status: DC | PRN
  Administered 2021-12-17: 4 mg via INTRAVENOUS

## 2021-12-17 MED ORDER — 0.9 % SODIUM CHLORIDE (POUR BTL) OPTIME
TOPICAL | Status: DC | PRN
  Administered 2021-12-17: 1000 mL

## 2021-12-17 MED ORDER — DIPHENHYDRAMINE HCL 12.5 MG/5ML PO ELIX: 12.5000 mg | ORAL_SOLUTION | ORAL | Status: DC | PRN

## 2021-12-17 MED ORDER — PROPOFOL 500 MG/50ML IV EMUL
INTRAVENOUS | Status: DC | PRN
  Administered 2021-12-17: 60 ug/kg/min via INTRAVENOUS

## 2021-12-17 MED ORDER — CEFAZOLIN SODIUM-DEXTROSE 2-4 GM/100ML-% IV SOLN
2.0000 g | INTRAVENOUS | Status: AC
  Administered 2021-12-17: 2 g via INTRAVENOUS
  Filled 2021-12-17: qty 100

## 2021-12-17 MED ORDER — ALUM & MAG HYDROXIDE-SIMETH 200-200-20 MG/5ML PO SUSP: 30.0000 mL | ORAL | Status: DC | PRN

## 2021-12-17 MED ORDER — STERILE WATER FOR IRRIGATION IR SOLN
Status: DC | PRN
  Administered 2021-12-17: 2000 mL

## 2021-12-17 MED ORDER — LACTATED RINGERS IV SOLN: INTRAVENOUS | Status: DC

## 2021-12-17 MED ORDER — DOCUSATE SODIUM 100 MG PO CAPS
100.0000 mg | ORAL_CAPSULE | Freq: Two times a day (BID) | ORAL | Status: DC
  Administered 2021-12-17 – 2021-12-19 (×4): 100 mg via ORAL
  Filled 2021-12-17 (×4): qty 1

## 2021-12-17 MED ORDER — FENTANYL CITRATE PF 50 MCG/ML IJ SOSY
25.0000 ug | PREFILLED_SYRINGE | INTRAMUSCULAR | Status: DC | PRN
  Administered 2021-12-17 (×2): 50 ug via INTRAVENOUS

## 2021-12-17 MED ORDER — FENTANYL CITRATE (PF) 100 MCG/2ML IJ SOLN
INTRAMUSCULAR | Status: AC
  Filled 2021-12-17: qty 2

## 2021-12-17 MED ORDER — FENTANYL CITRATE PF 50 MCG/ML IJ SOSY
PREFILLED_SYRINGE | INTRAMUSCULAR | Status: AC
  Filled 2021-12-17: qty 3

## 2021-12-17 MED ORDER — METHOCARBAMOL 500 MG IVPB - SIMPLE MED
500.0000 mg | Freq: Four times a day (QID) | INTRAVENOUS | Status: DC | PRN
  Administered 2021-12-17: 500 mg via INTRAVENOUS

## 2021-12-17 MED ORDER — ACETAMINOPHEN 325 MG PO TABS
325.0000 mg | ORAL_TABLET | Freq: Four times a day (QID) | ORAL | Status: DC | PRN
  Administered 2021-12-18 – 2021-12-19 (×3): 650 mg via ORAL
  Filled 2021-12-17 (×3): qty 2

## 2021-12-17 MED ORDER — ATORVASTATIN CALCIUM 40 MG PO TABS
40.0000 mg | ORAL_TABLET | Freq: Every day | ORAL | Status: DC
  Administered 2021-12-17 – 2021-12-18 (×2): 40 mg via ORAL
  Filled 2021-12-17 (×2): qty 1

## 2021-12-17 MED ORDER — METHOCARBAMOL 500 MG IVPB - SIMPLE MED
INTRAVENOUS | Status: AC
  Filled 2021-12-17: qty 55

## 2021-12-17 MED ORDER — PROPOFOL 10 MG/ML IV BOLUS
INTRAVENOUS | Status: DC | PRN
  Administered 2021-12-17: 50 mg via INTRAVENOUS

## 2021-12-17 MED ORDER — MIDAZOLAM HCL 2 MG/2ML IJ SOLN
INTRAMUSCULAR | Status: AC
  Filled 2021-12-17: qty 2

## 2021-12-17 MED ORDER — DEXAMETHASONE SODIUM PHOSPHATE 10 MG/ML IJ SOLN
INTRAMUSCULAR | Status: DC | PRN
  Administered 2021-12-17: 8 mg via INTRAVENOUS

## 2021-12-17 MED ORDER — PROPOFOL 1000 MG/100ML IV EMUL
INTRAVENOUS | Status: AC
Start: 2021-12-17 — End: ?
  Filled 2021-12-17: qty 100

## 2021-12-17 MED ORDER — DEXAMETHASONE SODIUM PHOSPHATE 10 MG/ML IJ SOLN
INTRAMUSCULAR | Status: AC
  Filled 2021-12-17: qty 1

## 2021-12-17 MED ORDER — PANTOPRAZOLE SODIUM 40 MG PO TBEC
40.0000 mg | DELAYED_RELEASE_TABLET | Freq: Every day | ORAL | Status: DC
  Administered 2021-12-17 – 2021-12-19 (×3): 40 mg via ORAL
  Filled 2021-12-17 (×3): qty 1

## 2021-12-17 MED ORDER — HYDROMORPHONE HCL 2 MG PO TABS: 2.0000 mg | ORAL_TABLET | ORAL | Status: DC | PRN

## 2021-12-17 MED ORDER — MIDAZOLAM HCL 2 MG/2ML IJ SOLN
INTRAMUSCULAR | Status: DC | PRN
  Administered 2021-12-17: 2 mg via INTRAVENOUS

## 2021-12-17 MED ORDER — METOCLOPRAMIDE HCL 5 MG/ML IJ SOLN: 5.0000 mg | Freq: Three times a day (TID) | INTRAMUSCULAR | Status: DC | PRN

## 2021-12-17 MED ORDER — ACETAMINOPHEN 500 MG PO TABS
1000.0000 mg | ORAL_TABLET | Freq: Once | ORAL | Status: AC
Start: 2021-12-17 — End: 2021-12-17
  Administered 2021-12-17: 1000 mg via ORAL
  Filled 2021-12-17: qty 2

## 2021-12-17 MED ORDER — METHOCARBAMOL 500 MG PO TABS
500.0000 mg | ORAL_TABLET | Freq: Four times a day (QID) | ORAL | Status: DC | PRN
  Administered 2021-12-17 – 2021-12-19 (×6): 500 mg via ORAL
  Filled 2021-12-17 (×6): qty 1

## 2021-12-17 MED ORDER — BUPIVACAINE IN DEXTROSE 0.75-8.25 % IT SOLN
INTRATHECAL | Status: DC | PRN
  Administered 2021-12-17: 2 mL via INTRATHECAL

## 2021-12-17 MED ORDER — LIDOCAINE HCL (PF) 2 % IJ SOLN
INTRAMUSCULAR | Status: AC
  Filled 2021-12-17: qty 5

## 2021-12-17 MED ORDER — TRANEXAMIC ACID-NACL 1000-0.7 MG/100ML-% IV SOLN
1000.0000 mg | INTRAVENOUS | Status: AC
  Administered 2021-12-17: 1000 mg via INTRAVENOUS
  Filled 2021-12-17: qty 100

## 2021-12-17 MED ORDER — ONDANSETRON HCL 4 MG PO TABS: 4.0000 mg | ORAL_TABLET | Freq: Four times a day (QID) | ORAL | Status: DC | PRN

## 2021-12-17 MED ORDER — ONDANSETRON HCL 4 MG/2ML IJ SOLN
4.0000 mg | Freq: Four times a day (QID) | INTRAMUSCULAR | Status: DC | PRN
  Administered 2021-12-18: 4 mg via INTRAVENOUS
  Filled 2021-12-17: qty 2

## 2021-12-17 MED ORDER — ONDANSETRON HCL 4 MG/2ML IJ SOLN
INTRAMUSCULAR | Status: AC
  Filled 2021-12-17: qty 2

## 2021-12-17 MED ORDER — HYDROMORPHONE HCL 1 MG/ML IJ SOLN: 0.5000 mg | INTRAMUSCULAR | Status: DC | PRN

## 2021-12-17 MED ORDER — METOCLOPRAMIDE HCL 5 MG PO TABS: 5.0000 mg | ORAL_TABLET | Freq: Three times a day (TID) | ORAL | Status: DC | PRN

## 2021-12-17 MED ORDER — OXYCODONE HCL 5 MG PO TABS
5.0000 mg | ORAL_TABLET | ORAL | Status: DC | PRN
  Administered 2021-12-17 – 2021-12-18 (×5): 10 mg via ORAL
  Filled 2021-12-17 (×5): qty 2

## 2021-12-17 MED ORDER — CEFAZOLIN SODIUM-DEXTROSE 1-4 GM/50ML-% IV SOLN
1.0000 g | Freq: Four times a day (QID) | INTRAVENOUS | Status: AC
  Administered 2021-12-17 – 2021-12-18 (×2): 1 g via INTRAVENOUS
  Filled 2021-12-17 (×2): qty 50

## 2021-12-17 MED ORDER — CHLORHEXIDINE GLUCONATE 0.12 % MT SOLN
15.0000 mL | Freq: Once | OROMUCOSAL | Status: AC
  Administered 2021-12-17: 15 mL via OROMUCOSAL

## 2021-12-17 MED ORDER — EPHEDRINE SULFATE-NACL 50-0.9 MG/10ML-% IV SOSY
PREFILLED_SYRINGE | INTRAVENOUS | Status: DC | PRN
  Administered 2021-12-17: 5 mg via INTRAVENOUS
  Administered 2021-12-17: 10 mg via INTRAVENOUS
  Administered 2021-12-17 (×2): 5 mg via INTRAVENOUS
  Administered 2021-12-17: 10 mg via INTRAVENOUS

## 2021-12-17 MED ORDER — SODIUM CHLORIDE 0.9 % IV SOLN: INTRAVENOUS | Status: DC

## 2021-12-17 MED ORDER — SODIUM CHLORIDE 0.9 % IR SOLN
Status: DC | PRN
  Administered 2021-12-17: 1000 mL

## 2021-12-17 MED ORDER — ORAL CARE MOUTH RINSE: 15.0000 mL | Freq: Once | OROMUCOSAL | Status: AC

## 2021-12-17 MED ORDER — LIDOCAINE 2% (20 MG/ML) 5 ML SYRINGE
INTRAMUSCULAR | Status: DC | PRN
  Administered 2021-12-17: 40 mg via INTRAVENOUS

## 2021-12-17 MED ORDER — PHENOL 1.4 % MT LIQD: 1.0000 | OROMUCOSAL | Status: DC | PRN

## 2021-12-17 MED ORDER — HYDROCHLOROTHIAZIDE 12.5 MG PO TABS
12.5000 mg | ORAL_TABLET | Freq: Every day | ORAL | Status: DC
  Filled 2021-12-17: qty 1

## 2021-12-17 MED ORDER — LOSARTAN POTASSIUM 25 MG PO TABS
25.0000 mg | ORAL_TABLET | Freq: Every day | ORAL | Status: DC
  Filled 2021-12-17: qty 1

## 2021-12-17 MED ORDER — PHENYLEPHRINE HCL-NACL 20-0.9 MG/250ML-% IV SOLN
INTRAVENOUS | Status: DC | PRN
  Administered 2021-12-17: 20 ug/min via INTRAVENOUS

## 2021-12-17 MED ORDER — EPHEDRINE 5 MG/ML INJ
INTRAVENOUS | Status: AC
  Filled 2021-12-17: qty 5

## 2021-12-17 MED ORDER — AMISULPRIDE (ANTIEMETIC) 5 MG/2ML IV SOLN: 10.0000 mg | Freq: Once | INTRAVENOUS | Status: DC | PRN

## 2021-12-17 SURGICAL SUPPLY — 41 items
BAG COUNTER SPONGE SURGICOUNT (BAG) ×1 IMPLANT
BAG ZIPLOCK 12X15 (MISCELLANEOUS) IMPLANT
BENZOIN TINCTURE PRP APPL 2/3 (GAUZE/BANDAGES/DRESSINGS) IMPLANT
BLADE SAW SGTL 18X1.27X75 (BLADE) ×1 IMPLANT
COVER PERINEAL POST (MISCELLANEOUS) ×1 IMPLANT
COVER SURGICAL LIGHT HANDLE (MISCELLANEOUS) ×1 IMPLANT
CUP ACET PNNCL SECTR W/GRIP 56 (Hips) IMPLANT
DRAPE FOOT SWITCH (DRAPES) ×1 IMPLANT
DRAPE STERI IOBAN 125X83 (DRAPES) ×1 IMPLANT
DRAPE U-SHAPE 47X51 STRL (DRAPES) ×2 IMPLANT
DRSG AQUACEL AG ADV 3.5X10 (GAUZE/BANDAGES/DRESSINGS) ×1 IMPLANT
DURAPREP 26ML APPLICATOR (WOUND CARE) ×1 IMPLANT
ELECT REM PT RETURN 15FT ADLT (MISCELLANEOUS) ×1 IMPLANT
FEM STEM 12/14 TAPER SZ 4 HIP (Orthopedic Implant) ×1 IMPLANT
FEMORAL STEM 12/14 TPR SZ4 HIP (Orthopedic Implant) IMPLANT
GAUZE XEROFORM 1X8 LF (GAUZE/BANDAGES/DRESSINGS) ×1 IMPLANT
GLOVE BIO SURGEON STRL SZ7.5 (GLOVE) ×1 IMPLANT
GLOVE BIOGEL PI IND STRL 8 (GLOVE) ×2 IMPLANT
GLOVE BIOGEL PI INDICATOR 8 (GLOVE) ×2
GLOVE ECLIPSE 8.0 STRL XLNG CF (GLOVE) ×1 IMPLANT
GOWN STRL REUS W/ TWL XL LVL3 (GOWN DISPOSABLE) ×2 IMPLANT
GOWN STRL REUS W/TWL XL LVL3 (GOWN DISPOSABLE) ×2
HANDPIECE INTERPULSE COAX TIP (DISPOSABLE) ×1
HEAD CERAMIC 36 PLUS5 (Hips) IMPLANT
HOLDER FOLEY CATH W/STRAP (MISCELLANEOUS) ×1 IMPLANT
KIT TURNOVER KIT A (KITS) IMPLANT
PACK ANTERIOR HIP CUSTOM (KITS) ×1 IMPLANT
PINN SECTOR W/GRIP ACE CUP 56 (Hips) ×1 IMPLANT
PINNACLE ALTRX PLUS 4 N 36X56 (Hips) IMPLANT
SET HNDPC FAN SPRY TIP SCT (DISPOSABLE) ×1 IMPLANT
STAPLER VISISTAT 35W (STAPLE) IMPLANT
STRIP CLOSURE SKIN 1/2X4 (GAUZE/BANDAGES/DRESSINGS) IMPLANT
SUT ETHIBOND NAB CT1 #1 30IN (SUTURE) ×1 IMPLANT
SUT ETHILON 2 0 PS N (SUTURE) IMPLANT
SUT MNCRL AB 4-0 PS2 18 (SUTURE) IMPLANT
SUT VIC AB 0 CT1 36 (SUTURE) ×1 IMPLANT
SUT VIC AB 1 CT1 36 (SUTURE) ×1 IMPLANT
SUT VIC AB 2-0 CT1 27 (SUTURE) ×2
SUT VIC AB 2-0 CT1 TAPERPNT 27 (SUTURE) ×2 IMPLANT
TRAY FOLEY MTR SLVR 16FR STAT (SET/KITS/TRAYS/PACK) IMPLANT
YANKAUER SUCT BULB TIP NO VENT (SUCTIONS) ×1 IMPLANT

## 2021-12-17 NOTE — Anesthesia Procedure Notes (Addendum)
Spinal  Patient location during procedure: OR Start time: 12/17/2021 12:50 PM End time: 12/17/2021 1:00 PM Reason for block: surgical anesthesia Staffing Performed: anesthesiologist  Anesthesiologist: Suzette Battiest, MD Performed by: Suzette Battiest, MD Authorized by: Suzette Battiest, MD   Preanesthetic Checklist Completed: patient identified, IV checked, site marked, risks and benefits discussed, surgical consent, monitors and equipment checked, pre-op evaluation and timeout performed Spinal Block Patient position: sitting Prep: DuraPrep Patient monitoring: heart rate, cardiac monitor, continuous pulse ox and blood pressure Approach: midline Location: L4-5 Injection technique: single-shot Needle Needle type: Pencan  Needle gauge: 24 G Needle length: 9 cm Assessment Sensory level: T4 Events: CSF return and second provider

## 2021-12-17 NOTE — Anesthesia Procedure Notes (Signed)
Procedure Name: MAC Date/Time: 12/17/2021 12:50 PM  Performed by: Eben Burow, CRNAPre-anesthesia Checklist: Patient identified, Emergency Drugs available, Suction available, Patient being monitored and Timeout performed Oxygen Delivery Method: Simple face mask Placement Confirmation: positive ETCO2

## 2021-12-17 NOTE — Brief Op Note (Signed)
12/17/2021  2:14 PM  PATIENT:  Douglas Grimes  61 y.o. male  PRE-OPERATIVE DIAGNOSIS:  osteoarthritis right hip  POST-OPERATIVE DIAGNOSIS:  osteoarthritis right hip  PROCEDURE:  Procedure(s): RIGHT TOTAL HIP ARTHROPLASTY ANTERIOR APPROACH (Right)  SURGEON:  Surgeon(s) and Role:    Mcarthur Rossetti, MD - Primary  PHYSICIAN ASSISTANT:  Benita Stabile, PA-C  ANESTHESIA:   spinal  EBL:  200 mL   COUNTS:  YES  DICTATION: .Other Dictation: Dictation Number [id number]  PLAN OF CARE: Admit for overnight observation  PATIENT DISPOSITION:  PACU - hemodynamically stable.   Delay start of Pharmacological VTE agent (>24hrs) due to surgical blood loss or risk of bleeding: no

## 2021-12-17 NOTE — Anesthesia Postprocedure Evaluation (Signed)
Anesthesia Post Note  Patient: Douglas Grimes  Procedure(s) Performed: RIGHT TOTAL HIP ARTHROPLASTY ANTERIOR APPROACH (Right: Hip)     Patient location during evaluation: PACU Anesthesia Type: Spinal Level of consciousness: awake and alert Pain management: pain level controlled Vital Signs Assessment: post-procedure vital signs reviewed and stable Respiratory status: spontaneous breathing and respiratory function stable Cardiovascular status: blood pressure returned to baseline and stable Postop Assessment: spinal receding Anesthetic complications: no   No notable events documented.  Last Vitals:  Vitals:   12/17/21 1630 12/17/21 1802  BP: 109/77 124/76  Pulse: 70 85  Resp: 10 12  Temp: 36.6 C 36.6 C  SpO2: 100% 95%    Last Pain:  Vitals:   12/17/21 1802  TempSrc: Oral  PainSc:                  Tiajuana Amass

## 2021-12-17 NOTE — Op Note (Unsigned)
NAME: Douglas Grimes, Douglas Grimes MEDICAL RECORD NO: [id number] ACCOUNT NO: [id number] DATE OF BIRTH: Jul 19, 1960 FACILITY: [facility] LOCATION: [facility] PHYSICIAN: Lind Guest. Ninfa Linden, MD  Operative Report   DATE OF PROCEDURE: 12/17/2021  PREOPERATIVE DIAGNOSIS:  Primary osteoarthritis and degenerative joint disease, right hip.  POSTOPERATIVE DIAGNOSIS:  Primary osteoarthritis and degenerative joint disease, right hip.  PROCEDURE:  Right total hip arthroplasty through direct anterior approach.  IMPLANTS:  DePuy Sector Gription acetabular component size 56, size 36+4 neutral polyethylene liner, size 4 ACTIS femoral component with high offset, size 36+5 ceramic hip ball.  SURGEON:  Lind Guest. Ninfa Linden, MD.  ASSISTANT:  Benita Stabile, PA-C.  ANESTHESIA:  Spinal.  ANTIBIOTICS:  2 g IV Ancef.  BLOOD LOSS:  235 mL  COMPLICATIONS:  None.  INDICATIONS:  The patient is a very pleasant 61 year old gentleman who has well documented severe arthritis involving his right hip.  He has tried and failed conservative treatment for well over a year.  At this point, his right hip pain is daily.  He is  not getting good sleep.  It is detrimentally affecting his quality of life, his mobility, and his activities of daily living to the point that he does wish to proceed with a total hip arthroplasty and we have recommended that as well.  We spoke in  length and detail about the risk of acute blood loss anemia, nerve or vessel injury, fracture, infection, dislocation, DVT, implant failure, leg length differences and skin and soft tissue issues.  We talked about our goals being decreased pain, improve  mobility and overall improve quality of life.  DESCRIPTION OF PROCEDURE:  After informed consent was obtained, appropriate right hip was marked.  He was brought to the operating room and sat up on the stretcher where spinal anesthesia was obtained.  He was then laid in supine position on the  stretcher.  Foley catheter  was placed and traction boots were placed on both his feet.  Next, he was placed supine on the Hana fracture table, the perineal post in place and both legs in line skeletal traction devices.  No traction applied.  His right  operative hip was prepped and draped with DuraPrep and sterile drapes.  A timeout was called.  He was identified as correct patient, correct right hip.  I then made an incision just inferior and posterior to the anterior superior iliac spine and carried  this obliquely down the leg.  We dissected down tensor fascia lata muscle.  Tensor fascia was then divided longitudinally to proceed with direct anterior approach to the hip.  I identified and cauterized circumflex vessels, then identified the hip  capsule, opened the hip capsule in L-type format, finding a moderate joint effusion and significant periarticular osteophytes around the lateral femoral head and neck.  Cobra retractors were placed around the medial and lateral femoral neck and a femoral  neck cut was made with an oscillating saw just proximal to the lesser trochanter and completed with an osteotome.  I then placed a corkscrew guide in the femoral head and removed the femoral head in its entirety and found a wide area devoid of  cartilage.  I then placed a bent Hohmann over the medial acetabular rim and removed remnants of acetabular labrum and other debris.  We then began reaming from a size 43 reamer in a stepwise increments going up to a size 55 reamer with all reamers placed  under direct visualization, the last reamer was also placed under direct fluoroscopy, so I  could obtain my depth of reaming, my inclination and my anteversion.  I then placed real DePuy Sector Gription acetabular component size 56 and went with a 36+4  polyethylene liner for that size acetabular component.  Attention was then turned to the femur.  With the leg externally rotated to 120 degrees and extended and adducted, we were able to place a Mueller  retractor medially and Hohmann retractor behind the  greater trochanter.  We released lateral joint capsule and used a box cutting osteotome to enter the femoral canal and a rongeur to lateralize, then began broaching using the ACTIS broaching system from a size 0 going to a size 4.  With a size 4 in  place, we trialed a high offset femoral neck and a 36+1.5 hip ball and reduced this in the acetabulum.  We felt like we needed a little bit more leg length.  We dislocated the hip, removed the trial components.  We placed the real ACTIS femoral  component, size 4 with high offset and the real 36+5 ceramic hip ball.  Again, this was reduced in the acetabulum and we were pleased with range of motion, offset, leg length and stability assessed mechanically and radiographically.  I then irrigated the  soft tissue with normal saline solution.  The joint capsule was closed with interrupted #1 Ethibond suture followed by #1 Vicryl to close the tensor fascia.  0 Vicryl was used to close the deep tissue and 2-0 Vicryl was used to close subcutaneous  tissue.  The skin was closed with staples.  An Aquacel dressing was applied.  The patient was taken off the Hana table and taken to recovery room in stable condition with all final counts being correct.  No complications noted.  Of note, Benita Stabile, PA-C, did assist in entire case from beginning to end.  His assistance was crucial and medically necessary for retracting soft tissues, helping guide implant placement and a layered closure of the wound.      PAA D: 12/17/2021 2:13:24 pm T: 12/17/2021 10:11:00 pm  JOB: [id number]/ [id number]

## 2021-12-17 NOTE — Transfer of Care (Signed)
Immediate Anesthesia Transfer of Care Note  Patient: Douglas Grimes  Procedure(s) Performed: RIGHT TOTAL HIP ARTHROPLASTY ANTERIOR APPROACH (Right: Hip)  Patient Location: PACU  Anesthesia Type:Spinal  Level of Consciousness: awake, drowsy and patient cooperative  Airway & Oxygen Therapy: Patient Spontanous Breathing and Patient connected to face mask oxygen  Post-op Assessment: Report given to RN and Post -op Vital signs reviewed and stable  Post vital signs: Reviewed and stable  Last Vitals:  Vitals Value Taken Time  BP 107/60 12/17/21 1437  Temp    Pulse 85 12/17/21 1438  Resp 15 12/17/21 1438  SpO2 100 % 12/17/21 1438  Vitals shown include unvalidated device data.  Last Pain:  Vitals:   12/17/21 1126  TempSrc: Oral  PainSc: 3       Patients Stated Pain Goal: 5 (17/53/01 0404)  Complications: No notable events documented.

## 2021-12-17 NOTE — Interval H&P Note (Signed)
History and Physical Interval Note: The patient understands that he is here today for right hip replacement to treat his severe right hip osteoarthritis.  There has been no acute or interval changes in medical status.  See recent H&P.  The risks and benefits of surgery been explained in detail and informed consent is obtained.  The right operative hip has been marked.  12/17/2021 11:32 AM  Douglas Grimes  has presented today for surgery, with the diagnosis of osteoarthritis right hip.  The various methods of treatment have been discussed with the patient and family. After consideration of risks, benefits and other options for treatment, the patient has consented to  Procedure(s): RIGHT TOTAL HIP ARTHROPLASTY ANTERIOR APPROACH (Right) as a surgical intervention.  The patient's history has been reviewed, patient examined, no change in status, stable for surgery.  I have reviewed the patient's chart and labs.  Questions were answered to the patient's satisfaction.     Mcarthur Rossetti

## 2021-12-17 NOTE — Anesthesia Preprocedure Evaluation (Signed)
Anesthesia Evaluation  Patient identified by MRN, date of birth, ID band Patient awake    Reviewed: Allergy & Precautions, NPO status , Patient's Chart, lab work & pertinent test results  Airway Mallampati: II  TM Distance: >3 FB Neck ROM: Full    Dental  (+) Dental Advisory Given   Pulmonary sleep apnea ,    breath sounds clear to auscultation       Cardiovascular hypertension, Pt. on medications  Rhythm:Regular Rate:Normal     Neuro/Psych negative neurological ROS     GI/Hepatic Neg liver ROS, GERD  ,  Endo/Other  negative endocrine ROS  Renal/GU negative Renal ROS     Musculoskeletal  (+) Arthritis ,   Abdominal   Peds  Hematology negative hematology ROS (+)   Anesthesia Other Findings   Reproductive/Obstetrics                             Anesthesia Physical Anesthesia Plan  ASA: 2  Anesthesia Plan: Spinal   Post-op Pain Management: Tylenol PO (pre-op)*   Induction:   PONV Risk Score and Plan: 1 and Propofol infusion, Ondansetron, Treatment may vary due to age or medical condition and Dexamethasone  Airway Management Planned: Natural Airway and Simple Face Mask  Additional Equipment:   Intra-op Plan:   Post-operative Plan:   Informed Consent: I have reviewed the patients History and Physical, chart, labs and discussed the procedure including the risks, benefits and alternatives for the proposed anesthesia with the patient or authorized representative who has indicated his/her understanding and acceptance.       Plan Discussed with: CRNA  Anesthesia Plan Comments:         Anesthesia Quick Evaluation

## 2021-12-17 NOTE — Plan of Care (Signed)
  Problem: Education: Goal: Knowledge of General Education information will improve Description Including pain rating scale, medication(s)/side effects and non-pharmacologic comfort measures Outcome: Progressing   

## 2021-12-17 NOTE — Progress Notes (Signed)
Pt set up on his home cpap. Sterile water added and plugged in red outlet. Pt prefer self placement.

## 2021-12-18 DIAGNOSIS — M1611 Unilateral primary osteoarthritis, right hip: Secondary | ICD-10-CM | POA: Diagnosis not present

## 2021-12-18 LAB — CBC
HCT: 39.6 % (ref 39.0–52.0)
Hemoglobin: 13.6 g/dL (ref 13.0–17.0)
MCH: 32.3 pg (ref 26.0–34.0)
MCHC: 34.3 g/dL (ref 30.0–36.0)
MCV: 94.1 fL (ref 80.0–100.0)
Platelets: 170 10*3/uL (ref 150–400)
RBC: 4.21 MIL/uL — ABNORMAL LOW (ref 4.22–5.81)
RDW: 11.8 % (ref 11.5–15.5)
WBC: 12.1 10*3/uL — ABNORMAL HIGH (ref 4.0–10.5)
nRBC: 0 % (ref 0.0–0.2)

## 2021-12-18 LAB — BASIC METABOLIC PANEL
Anion gap: 10 (ref 5–15)
BUN: 19 mg/dL (ref 8–23)
CO2: 25 mmol/L (ref 22–32)
Calcium: 8.5 mg/dL — ABNORMAL LOW (ref 8.9–10.3)
Chloride: 100 mmol/L (ref 98–111)
Creatinine, Ser: 1.16 mg/dL (ref 0.61–1.24)
GFR, Estimated: >60 mL/min (ref >60)
Glucose, Bld: 148 mg/dL — ABNORMAL HIGH (ref 70–99)
Potassium: 4.2 mmol/L (ref 3.5–5.1)
Sodium: 135 mmol/L (ref 135–145)

## 2021-12-18 MED ORDER — OXYCODONE HCL 5 MG PO TABS: 5.0000 mg | ORAL_TABLET | ORAL | 0 refills | Status: DC | PRN

## 2021-12-18 MED ORDER — ASPIRIN 81 MG PO CHEW: 81.0000 mg | CHEWABLE_TABLET | Freq: Two times a day (BID) | ORAL | 0 refills | Status: DC

## 2021-12-18 MED ORDER — ONDANSETRON 4 MG PO TBDP
4.0000 mg | ORAL_TABLET | Freq: Three times a day (TID) | ORAL | 0 refills | Status: DC | PRN
Start: 2021-12-18 — End: 2022-03-29

## 2021-12-18 MED ORDER — METHOCARBAMOL 500 MG PO TABS: 500.0000 mg | ORAL_TABLET | Freq: Four times a day (QID) | ORAL | 1 refills | Status: DC | PRN

## 2021-12-18 NOTE — Plan of Care (Signed)
  Problem: Education: Goal: Knowledge of General Education information will improve Description: Including pain rating scale, medication(s)/side effects and non-pharmacologic comfort measures Outcome: Progressing   Problem: Health Behavior/Discharge Planning: Goal: Ability to manage health-related needs will improve Outcome: Progressing   Problem: Activity: Goal: Risk for activity intolerance will decrease Outcome: Progressing   

## 2021-12-18 NOTE — Progress Notes (Signed)
Physical Therapy Treatment Patient Details Name: Douglas Grimes MRN: [id number] DOB: [birth year] Today's Date: 12/18/2021   History of Present Illness Pt s/p R THR    PT Comments    Pt in good spirits and with no c/o dizziness or nausea this session.  Pt up to ambulate in hall and assisted back to bed with no c/o dizziness or nausea.  BP supine 118/67; sit 130/78; stand 122/77; stand 3 min 116/72 and after walking 119/76.  Pt hopeful for dc home tomorrow.   Recommendations for follow up therapy are one component of a multi-disciplinary discharge planning process, led by the attending physician.  Recommendations may be updated based on patient status, additional functional criteria and insurance authorization.  Follow Up Recommendations  Follow physician's recommendations for discharge plan and follow up therapies     Assistance Recommended at Discharge Frequent or constant Supervision/Assistance  Patient can return home with the following Assistance with cooking/housework;Assist for transportation;Help with stairs or ramp for entrance;A little help with walking and/or transfers;A little help with bathing/dressing/bathroom   Equipment Recommendations  Rolling walker (2 wheels)    Recommendations for Other Services       Precautions / Restrictions Precautions Precautions: Fall Restrictions Weight Bearing Restrictions: No Other Position/Activity Restrictions: WBAT     Mobility  Bed Mobility Overal bed mobility: Needs Assistance Bed Mobility: Sit to Supine     Supine to sit: Min assist Sit to supine: Min assist   General bed mobility comments: increased time with cues for sequence and use of L LE to self assist    Transfers Overall transfer level: Needs assistance Equipment used: Rolling walker (2 wheels) Transfers: Sit to/from Stand Sit to Stand: Min assist   Step pivot transfers: Min assist, Mod assist       General transfer comment: cues for LE management and  use of UEs to self assist    Ambulation/Gait Ambulation/Gait assistance: Min assist, Min guard Gait Distance (Feet): 111 Feet Assistive device: Rolling walker (2 wheels) Gait Pattern/deviations: Step-to pattern, Decreased step length - right, Decreased step length - left, Shuffle, Trunk flexed Gait velocity: decr     General Gait Details: cues for posture, sequence and position from [employer] Mobility    Modified Rankin (Stroke Patients Only)       Balance Overall balance assessment: Needs assistance Sitting-balance support: Feet supported, No upper extremity supported Sitting balance-Leahy Scale: Good     Standing balance support: No upper extremity supported Standing balance-Leahy Scale: Fair                              Cognition Arousal/Alertness: Awake/alert Behavior During Therapy: WFL for tasks assessed/performed Overall Cognitive Status: Within Functional Limits for tasks assessed                                          Exercises Total Joint Exercises Ankle Circles/Pumps: AROM, Both, 15 reps, Supine Quad Sets: AROM, Both, 5 reps, Supine Heel Slides: AAROM, Right, 15 reps, Supine Hip ABduction/ADduction: AAROM, Right, 15 reps, Supine    General Comments        Pertinent Vitals/Pain Pain Assessment Pain Assessment: 0-10 Pain Score: 4  Pain Location: R hip/thigh Pain Descriptors / Indicators: Aching, Cramping, Grimacing, Guarding, Sore  Pain Intervention(s): Limited activity within patient's tolerance, Monitored during session, Premedicated before session, Ice applied    Home Living                          Prior Function            PT Goals (current goals can now be found in the care plan section) Acute Rehab PT Goals Patient Stated Goal: Regain IND PT Goal Formulation: With patient Time For Goal Achievement: 12/24/21 Potential to Achieve Goals: Good Progress towards PT  goals: Progressing toward goals    Frequency    7X/week      PT Plan Current plan remains appropriate    Co-evaluation              AM-PAC PT "6 Clicks" Mobility   Outcome Measure  Help needed turning from your back to your side while in a flat bed without using bedrails?: A Little Help needed moving from lying on your back to sitting on the side of a flat bed without using bedrails?: A Little Help needed moving to and from a bed to a chair (including a wheelchair)?: A Little Help needed standing up from a chair using your arms (e.g., wheelchair or bedside chair)?: A Little Help needed to walk in hospital room?: A Little Help needed climbing 3-5 steps with a railing? : A Lot 6 Click Score: 17    End of Session Equipment Utilized During Treatment: Gait belt Activity Tolerance: Patient tolerated treatment well Patient left: in bed;with call bell/phone within reach;with bed alarm set;with family/visitor present Nurse Communication: Mobility status PT Visit Diagnosis: Unsteadiness on feet (R26.81);Difficulty in walking, not elsewhere classified (R26.2)     Time: 5056-9794 PT Time Calculation (min) (ACUTE ONLY): 31 min  Charges:  $Gait Training: 23-37 mins                     [place] Pager [phone] Office [phone]    [facility] 12/18/2021, 4:37 PM

## 2021-12-18 NOTE — Progress Notes (Signed)
Subjective: 1 Day Post-Op Procedure(s) (LRB): RIGHT TOTAL HIP ARTHROPLASTY ANTERIOR APPROACH (Right) Patient reports pain as moderate.  Has been up this am, but slow going due to nausea.  Objective: Vital signs in last 24 hours: Temp:  [97.8 F (36.6 C)-99.5 F (37.5 C)] 97.9 F (36.6 C) (08/26 0938) Pulse Rate:  [63-94] 68 (08/26 0938) Resp:  [10-18] 17 (08/26 0938) BP: (107-141)/(60-84) 123/79 (08/26 0938) SpO2:  [95 %-100 %] 95 % (08/26 0938) Weight:  [104.3 kg] 104.3 kg (08/25 1126)  Intake/Output from previous day: 08/25 0701 - 08/26 0700 In: 2279 [I.V.:1974; IV Piggyback:305] Out: 1750 [Urine:1550; Blood:200] Intake/Output this shift: Total I/O In: 240 [P.O.:240] Out: 0   Recent Labs    12/18/21 0400  HGB 13.6   Recent Labs    12/18/21 0400  WBC 12.1*  RBC 4.21*  HCT 39.6  PLT 170   Recent Labs    12/18/21 0400  NA 135  K 4.2  CL 100  CO2 25  BUN 19  CREATININE 1.16  GLUCOSE 148*  CALCIUM 8.5*   No results for input(s): "LABPT", "INR" in the last 72 hours.  Sensation intact distally Intact pulses distally Dorsiflexion/Plantar flexion intact Incision: dressing C/D/I   Assessment/Plan: 1 Day Post-Op Procedure(s) (LRB): RIGHT TOTAL HIP ARTHROPLASTY ANTERIOR APPROACH (Right) Up with therapy Discharge home with home health      Mcarthur Rossetti 12/18/2021, 11:21 AM

## 2021-12-18 NOTE — Plan of Care (Signed)
  Problem: Pain Management: Goal: Pain level will decrease with appropriate interventions Outcome: Progressing   Problem: Pain Management: Goal: Pain level will decrease with appropriate interventions Outcome: Progressing   Problem: Nutrition: Goal: Adequate nutrition will be maintained Outcome: Progressing

## 2021-12-18 NOTE — Evaluation (Signed)
Physical Therapy Evaluation Patient Details Name: Douglas Grimes MRN: [id number] DOB: Jun 06, 1960 Today's Date: 12/18/2021  History of Present Illness  Pt s/p R THR  Clinical Impression  Pt s/p R THR and presents with decreased R LE strength/ROM, post op pain, dizziness and nausea with attempts at OOB activity (this date BP 104/73), and balance deficits.  Pt should progress to dc home with family assist.     Recommendations for follow up therapy are one component of a multi-disciplinary discharge planning process, led by the attending physician.  Recommendations may be updated based on patient status, additional functional criteria and insurance authorization.  Follow Up Recommendations Follow physician's recommendations for discharge plan and follow up therapies      Assistance Recommended at Discharge Frequent or constant Supervision/Assistance  Patient can return home with the following  A lot of help with walking and/or transfers;A lot of help with bathing/dressing/bathroom;Assistance with cooking/housework;Assist for transportation;Help with stairs or ramp for entrance    Equipment Recommendations Rolling walker (2 wheels)  Recommendations for Other Services       Functional Status Assessment Patient has had a recent decline in their functional status and demonstrates the ability to make significant improvements in function in a reasonable and predictable amount of time.     Precautions / Restrictions Precautions Precautions: Fall Restrictions Weight Bearing Restrictions: No Other Position/Activity Restrictions: WBAT      Mobility  Bed Mobility Overal bed mobility: Needs Assistance Bed Mobility: Supine to Sit     Supine to sit: Min assist     General bed mobility comments: increased time with cues for sequence and use of L LE to self assist    Transfers Overall transfer level: Needs assistance Equipment used: Rolling walker (2 wheels) Transfers: Sit to/from  Stand, Bed to chair/wheelchair/BSC Sit to Stand: Min assist, Mod assist, From elevated surface   Step pivot transfers: Min assist, Mod assist       General transfer comment: cues for LE management and use of UEs to self assist    Ambulation/Gait Ambulation/Gait assistance: Min assist Gait Distance (Feet): 2 Feet Assistive device: Rolling walker (2 wheels) Gait Pattern/deviations: Step-to pattern, Decreased step length - right, Decreased step length - left, Shuffle, Trunk flexed Gait velocity: decr     General Gait Details: cues for posture, sequence and position from [employer]            Wheelchair Mobility    Modified Rankin (Stroke Patients Only)       Balance Overall balance assessment: Needs assistance Sitting-balance support: Feet supported, No upper extremity supported Sitting balance-Leahy Scale: Fair     Standing balance support: Bilateral upper extremity supported Standing balance-Leahy Scale: Poor                               Pertinent Vitals/Pain Pain Assessment Pain Assessment: 0-10 Pain Score: 7  Pain Location: R hip/thigh Pain Descriptors / Indicators: Aching, Cramping, Grimacing, Guarding, Sore Pain Intervention(s): Limited activity within patient's tolerance, Monitored during session, Premedicated before session, Ice applied    Home Living Family/patient expects to be discharged to:: Private residence Living Arrangements: Spouse/significant other Available Help at Discharge: Family;Available 24 hours/day Type of Home: House Home Access: Stairs to enter   [employer] of Steps: 1   Home Layout: One level Home Equipment: Cane - single point;Standard Environmental consultant      Prior Function Prior Level of Function : Independent/Modified Independent  Mobility Comments: used cane as needed       Hand Dominance        Extremity/Trunk Assessment   Upper Extremity Assessment Upper Extremity Assessment:  Overall WFL for tasks assessed    Lower Extremity Assessment Lower Extremity Assessment: RLE deficits/detail RLE Deficits / Details: 2/5 strength at hip with AAROM at hip to 85 flex and 15 abd    Cervical / Trunk Assessment Cervical / Trunk Assessment: Normal  Communication   Communication: No difficulties  Cognition Arousal/Alertness: Awake/alert Behavior During Therapy: WFL for tasks assessed/performed Overall Cognitive Status: Within Functional Limits for tasks assessed                                          General Comments      Exercises Total Joint Exercises Ankle Circles/Pumps: AROM, Both, 15 reps, Supine Quad Sets: AROM, Both, 5 reps, Supine Heel Slides: AAROM, Right, 15 reps, Supine Hip ABduction/ADduction: AAROM, Right, 15 reps, Supine   Assessment/Plan    PT Assessment Patient needs continued PT services  PT Problem List Decreased strength;Decreased range of motion;Decreased activity tolerance;Decreased balance;Decreased mobility;Decreased knowledge of use of DME;Pain       PT Treatment Interventions DME instruction;Gait training;Stair training;Functional mobility training;Therapeutic activities;Therapeutic exercise;Patient/family education    PT Goals (Current goals can be found in the Care Plan section)  Acute Rehab PT Goals Patient Stated Goal: Regain IND PT Goal Formulation: With patient Time For Goal Achievement: 12/24/21 Potential to Achieve Goals: Good    Frequency 7X/week     Co-evaluation               AM-PAC PT "6 Clicks" Mobility  Outcome Measure Help needed turning from your back to your side while in a flat bed without using bedrails?: A Lot Help needed moving from lying on your back to sitting on the side of a flat bed without using bedrails?: A Lot Help needed moving to and from a bed to a chair (including a wheelchair)?: A Lot Help needed standing up from a chair using your arms (e.g., wheelchair or bedside  chair)?: A Lot Help needed to walk in hospital room?: Total Help needed climbing 3-5 steps with a railing? : Total 6 Click Score: 10    End of Session Equipment Utilized During Treatment: Gait belt Activity Tolerance: Patient limited by fatigue;Other (comment) (c/o dizziness) Patient left: in chair;with call bell/phone within reach;with chair alarm set;with family/visitor present Nurse Communication: Mobility status PT Visit Diagnosis: Unsteadiness on feet (R26.81);Difficulty in walking, not elsewhere classified (R26.2)    Time: 7341-9379 PT Time Calculation (min) (ACUTE ONLY): 50 min   Charges:   PT Evaluation $PT Eval Low Complexity: 1 Low PT Treatments $Therapeutic Exercise: 8-22 mins $Therapeutic Activity: 8-22 mins        Debe Coder PT Acute Rehabilitation Services Pager [phone] Office [phone]   Landynn Dupler 12/18/2021, 11:49 AM

## 2021-12-18 NOTE — Discharge Instructions (Signed)

## 2021-12-18 NOTE — TOC Transition Note (Signed)
Transition of Care [facility]) - CM/SW Discharge Note   Patient Details  Name: Douglas Grimes MRN: [id number] Date of Birth: 17-Sep-1960  Transition of [facility]) CM/SW Contact:  Ross Ludwig, LCSW Phone Number: 12/18/2021, 12:06 PM   Clinical Narrative:     CSW was informed that patient will need a rolling walker.  CSW contacted [employer] and ordered walker.  It should be delivered today to patient's room.  CSW signing off, please reconsult if other social work needs arise.  Final next level of care: Home/Self Care Barriers to Discharge: Barriers Resolved   Patient Goals and CMS Choice Patient states their goals for this hospitalization and ongoing recovery are:: To return back home. CMS Medicare.gov Compare Post Acute Care list provided to:: Patient Choice offered to / list presented to : Patient  Discharge Placement                       Discharge Plan and Services                DME Arranged: Walker rolling DME Agency: [employer] Date DME Agency Contacted: 12/18/21 Time DME Agency Contacted: 1204 Representative spoke with at DME Agency: [place] ([place]) Interventions     Readmission Risk Interventions     No data to display

## 2021-12-19 DIAGNOSIS — M1611 Unilateral primary osteoarthritis, right hip: Secondary | ICD-10-CM | POA: Diagnosis not present

## 2021-12-19 MED ORDER — HYDROMORPHONE HCL 2 MG PO TABS: 1.0000 mg | ORAL_TABLET | ORAL | 0 refills | Status: AC | PRN

## 2021-12-19 NOTE — Plan of Care (Signed)
  Problem: Education: Goal: Knowledge of General Education information will improve Description: Including pain rating scale, medication(s)/side effects and non-pharmacologic comfort measures Outcome: Progressing   Problem: Health Behavior/Discharge Planning: Goal: Ability to manage health-related needs will improve Outcome: Progressing   Problem: Pain Managment: Goal: General experience of comfort will improve Outcome: Progressing   Problem: Activity: Goal: Risk for activity intolerance will decrease Outcome: Progressing

## 2021-12-19 NOTE — Progress Notes (Signed)
  Subjective: Douglas Grimes is a 61 y.o. male s/p right THA.  They are POD 2.  Pt's pain is controlled.  Pt has ambulated with some difficulty.  Denies any dizziness or lightheadedness with ambulation.  Denies any chest pain, shortness of breath, abdominal pain.  Feels progressive improvement of his pain compared with yesterday.  He is ready for discharge home.  Worked with physical therapy this morning and was able to ambulate 140 feet and ascend stairs  Objective: Vital signs in last 24 hours: Temp:  [98.3 F (36.8 C)-100.2 F (37.9 C)] 98.5 F (36.9 C) (08/27 0542) Pulse Rate:  [71-92] 80 (08/27 0757) Resp:  [17-20] 20 (08/27 0542) BP: (114-125)/(61-75) 122/75 (08/27 0757) SpO2:  [94 %-99 %] 95 % (08/27 0542)  Intake/Output from previous day: 08/26 0701 - 08/27 0700 In: 1519 [P.O.:720; I.V.:799] Out: 3000 [Urine:3000] Intake/Output this shift: Total I/O In: 240 [P.O.:240] Out: -   Exam:  No gross blood or drainage overlying the dressing Right foot warm well perfused Sensation intact distally in the right foot Able to dorsiflex and plantarflex the right foot No calf tenderness bilaterally.  Negative Homans' sign bilaterally.   Labs: Recent Labs    12/18/21 0400  HGB 13.6   Recent Labs    12/18/21 0400  WBC 12.1*  RBC 4.21*  HCT 39.6  PLT 170   Recent Labs    12/18/21 0400  NA 135  K 4.2  CL 100  CO2 25  BUN 19  CREATININE 1.16  GLUCOSE 148*  CALCIUM 8.5*   No results for input(s): "LABPT", "INR" in the last 72 hours.  Assessment/Plan: Pt is POD 2 s/p right THA.    -Plan to discharge to home today   -WBAT with a walker  -Follow-up with Dr. Ninfa Linden at given appointment date in about 2 weeks     Donella Stade 12/19/2021, 10:17 AM

## 2021-12-19 NOTE — Progress Notes (Signed)
Physical Therapy Treatment Patient Details Name: Douglas Grimes MRN: [id number] DOB: Jun 03, 1960 Today's Date: 12/19/2021   History of Present Illness Pt s/p R THR    PT Comments    Pt in good spirits and progressing well with mobility.  Pt performed therex program, up to bathroom for toileting and hygiene at sink, ambulated in hall, negotiated step, and reviewed car transfers.  Pt eager for dc home this date.   Recommendations for follow up therapy are one component of a multi-disciplinary discharge planning process, led by the attending physician.  Recommendations may be updated based on patient status, additional functional criteria and insurance authorization.  Follow Up Recommendations  Follow physician's recommendations for discharge plan and follow up therapies     Assistance Recommended at Discharge Frequent or constant Supervision/Assistance  Patient can return home with the following Assistance with cooking/housework;Assist for transportation;Help with stairs or ramp for entrance;A little help with walking and/or transfers;A little help with bathing/dressing/bathroom   Equipment Recommendations  Rolling walker (2 wheels)    Recommendations for Other Services       Precautions / Restrictions Precautions Precautions: Fall Restrictions Weight Bearing Restrictions: No Other Position/Activity Restrictions: WBAT     Mobility  Bed Mobility Overal bed mobility: Needs Assistance Bed Mobility: Supine to Sit     Supine to sit: Min guard, Supervision     General bed mobility comments: increased time with cues for sequence and use of L LE to self assist    Transfers Overall transfer level: Needs assistance Equipment used: Rolling walker (2 wheels) Transfers: Sit to/from Stand Sit to Stand: Supervision           General transfer comment: MIn cues for LE management and use of UEs to self assist    Ambulation/Gait Ambulation/Gait assistance: Min guard,  Supervision Gait Distance (Feet): 140 Feet Assistive device: Rolling walker (2 wheels) Gait Pattern/deviations: Decreased step length - right, Decreased step length - left, Shuffle, Trunk flexed, Step-to pattern, Step-through pattern Gait velocity: decr     General Gait Details: min cues for posture, sequence and position from RW   Stairs Stairs: Yes Stairs assistance: Min assist Stair Management: No rails, Step to pattern, Forwards, With walker Number of Stairs: 2 General stair comments: single step twice with cues for sequence   Wheelchair Mobility    Modified Rankin (Stroke Patients Only)       Balance Overall balance assessment: Mild deficits observed, not formally tested                                          Cognition Arousal/Alertness: Awake/alert Behavior During Therapy: WFL for tasks assessed/performed Overall Cognitive Status: Within Functional Limits for tasks assessed                                          Exercises Total Joint Exercises Ankle Circles/Pumps: AROM, Both, 15 reps, Supine Quad Sets: AROM, Both, 5 reps, Supine Heel Slides: AAROM, Right, 15 reps, Supine Hip ABduction/ADduction: AAROM, Right, 15 reps, Supine    General Comments        Pertinent Vitals/Pain Pain Assessment Pain Assessment: 0-10 Pain Score: 4  Pain Location: R hip/thigh Pain Descriptors / Indicators: Aching, Cramping, Grimacing, Guarding, Sore Pain Intervention(s): Limited activity within patient's tolerance, Monitored during session,  Premedicated before session    Home Living                          Prior Function            PT Goals (current goals can now be found in the care plan section) Acute Rehab PT Goals Patient Stated Goal: Regain IND PT Goal Formulation: With patient Time For Goal Achievement: 12/24/21 Potential to Achieve Goals: Good Progress towards PT goals: Progressing toward goals     Frequency    7X/week      PT Plan Current plan remains appropriate    Co-evaluation              AM-PAC PT "6 Clicks" Mobility   Outcome Measure  Help needed turning from your back to your side while in a flat bed without using bedrails?: A Little Help needed moving from lying on your back to sitting on the side of a flat bed without using bedrails?: A Little Help needed moving to and from a bed to a chair (including a wheelchair)?: A Little Help needed standing up from a chair using your arms (e.g., wheelchair or bedside chair)?: A Little Help needed to walk in hospital room?: A Little Help needed climbing 3-5 steps with a railing? : A Little 6 Click Score: 18    End of Session Equipment Utilized During Treatment: Gait belt Activity Tolerance: Patient tolerated treatment well Patient left: in chair;with call bell/phone within reach;with family/visitor present Nurse Communication: Mobility status PT Visit Diagnosis: Unsteadiness on feet (R26.81);Difficulty in walking, not elsewhere classified (R26.2)     Time: 1740-8144 PT Time Calculation (min) (ACUTE ONLY): 30 min  Charges:  $Gait Training: 8-22 mins $Therapeutic Activity: 8-22 mins                     [place] Pager [phone] Office [phone]    Cashae Weich 12/19/2021, 9:08 AM

## 2021-12-19 NOTE — Progress Notes (Signed)
Patient has walker for home. Patient is asking for a 3-in-1. The 3-in-1 will help aid in patient's mobility, aid in patient's recovery from hip surgery, and help prevent excess pressure from being exerted on the patient's incision when sitting down to use the bathroom.

## 2021-12-19 NOTE — Progress Notes (Signed)
The patient is alert and oriented and has been seen by his physician. The orders for discharge were written. IV has been removed. Went over discharge instructions with patient and family. He is being discharged via wheelchair with all of his belongings.  

## 2021-12-19 NOTE — Progress Notes (Signed)
Patient and family now stating they will order 3-in-1 off of [employer]. Patient eager for discharge.

## 2021-12-20 ENCOUNTER — Encounter (HOSPITAL_COMMUNITY): Payer: Self-pay | Admitting: Orthopaedic Surgery

## 2021-12-23 NOTE — Discharge Summary (Signed)
Patient ID: Douglas Grimes MRN: [id number] DOB/AGE: Jan 12, 1961 61 y.o.  Admit date: 12/17/2021 Discharge date: 12/19/21  Admission Diagnoses:  Principal Problem:   Unilateral primary osteoarthritis, right hip Active Problems:   Status post total replacement of right hip   Discharge Diagnoses:  Same  Past Medical History:  Diagnosis Date   Allergic rhinitis    Arthritis    Cancer ([place])    GERD (gastroesophageal reflux disease)    Hyperlipidemia    Hypertension    MRSA (methicillin resistant Staphylococcus aureus)    MRSA infection    right leg-posterior are behind knee   Pre-diabetes    Sleep apnea    NPSG 1990 AHI 74.1    Surgeries: Procedure(s): RIGHT TOTAL HIP ARTHROPLASTY ANTERIOR APPROACH on 12/17/2021   Consultants:   Discharged Condition: Improved  Hospital Course: Douglas Grimes is an 61 y.o. male who was admitted 12/17/2021 for operative treatment ofUnilateral primary osteoarthritis, right hip. Patient has severe unremitting pain that affects sleep, daily activities, and work/hobbies. After pre-op clearance the patient was taken to the operating room on 12/17/2021 and underwent  Procedure(s): RIGHT TOTAL HIP ARTHROPLASTY ANTERIOR APPROACH.    Patient was given perioperative antibiotics:  Anti-infectives (From admission, onward)    Start     Dose/Rate Route Frequency Ordered Stop   12/17/21 1900  ceFAZolin (ANCEF) IVPB 1 g/50 mL premix        1 g 100 mL/hr over 30 Minutes Intravenous Every 6 hours 12/17/21 1800 12/18/21 0212   12/17/21 1115  ceFAZolin (ANCEF) IVPB 2g/100 mL premix        2 g 200 mL/hr over 30 Minutes Intravenous On call to O.R. 12/17/21 1113 12/17/21 1304        Patient was given sequential compression devices, early ambulation, and chemoprophylaxis to prevent DVT.  Patient benefited maximally from hospital stay and there were no complications.    Recent vital signs: No data found.   Recent laboratory studies: No results for  input(s): "WBC", "HGB", "HCT", "PLT", "NA", "K", "CL", "CO2", "BUN", "CREATININE", "GLUCOSE", "INR", "CALCIUM" in the last 72 hours.  Invalid input(s): "PT", "2"   Discharge Medications:   Allergies as of 12/19/2021       Reactions   Iodine Rash   Sulfonamide Derivatives Rash   Iodinated Contrast Media Rash   Hydrocodone Nausea And Vomiting   Nasacort [triamcinolone]    Severe headaches   Tramadol Rash        Medication List     TAKE these medications    acetaminophen 325 MG tablet Commonly known as: TYLENOL Take 650 mg by mouth every 6 (six) hours as needed for moderate pain.   aspirin 81 MG chewable tablet Chew 1 tablet (81 mg total) by mouth 2 (two) times daily.   atorvastatin 40 MG tablet Commonly known as: LIPITOR Take 40 mg by mouth daily.   celecoxib 100 MG capsule Commonly known as: CELEBREX Take 100 mg by mouth 2 (two) times daily.   hydrochlorothiazide 12.5 MG capsule Commonly known as: MICROZIDE Take 12.5 mg by mouth daily.   HYDROmorphone 2 MG tablet Commonly known as: Dilaudid Take 0.5 tablets (1 mg total) by mouth every 4 (four) hours as needed for up to 5 days for severe pain.   losartan 25 MG tablet Commonly known as: COZAAR Take by mouth.   methocarbamol 500 MG tablet Commonly known as: ROBAXIN Take 1 tablet (500 mg total) by mouth every 6 (six) hours as needed for muscle spasms.  ondansetron 4 MG disintegrating tablet Commonly known as: ZOFRAN-ODT Take 1 tablet (4 mg total) by mouth every 8 (eight) hours as needed for nausea or vomiting.        Diagnostic Studies: DG Pelvis Portable  Result Date: 12/17/2021 CLINICAL DATA:  [id number]; status post right hip replacement images done in recovery EXAM: PORTABLE PELVIS 1 VIEW COMPARISON:  None Available. FINDINGS: Right hip prosthesis and the prosthetic components are in near anatomical alignment. Soft tissue emphysema. Surgical clips. Mild degenerative changes of the left joint. Bilateral SI  joints and the pubic symphysis are congruent. IMPRESSION: Right hip prosthesis and the prosthetic components are in near anatomical alignment. Electronically Signed   By: Frazier Richards M.D.   On: 12/17/2021 14:52   DG HIP UNILAT WITH PELVIS 1V RIGHT  Result Date: 12/17/2021 CLINICAL DATA:  Right total hip replacement. EXAM: DG HIP (WITH OR WITHOUT PELVIS) 1V RIGHT; DG C-ARM 1-60 MIN-NO REPORT 7 intraoperative images are submitted. COMPARISON:  Right hip radiographs 11/17/2021 FINDINGS: Intraoperative images demonstrate placement of a right total hip arthroplasty. Joint is located. No acute fractures are present. Components appear to be well seated. IMPRESSION: Right total hip arthroplasty without radiographic evidence for complication. Electronically Signed   By: San Morelle M.D.   On: 12/17/2021 14:34   DG C-Arm 1-60 Min-No Report  Result Date: 12/17/2021 Fluoroscopy was utilized by the requesting physician.  No radiographic interpretation.    Disposition: Discharge disposition: 01-Home or Self Care       Discharge Instructions     Call MD / Call 911   Complete by: As directed    If you experience chest pain or shortness of breath, CALL 911 and be transported to the hospital emergency room.  If you develope a fever above 101 F, pus (white drainage) or increased drainage or redness at the wound, or calf pain, call your surgeon's office.   Constipation Prevention   Complete by: As directed    Drink plenty of fluids.  Prune juice may be helpful.  You may use a stool softener, such as Colace (over the counter) 100 mg twice a day.  Use MiraLax (over the counter) for constipation as needed.   Diet - low sodium heart healthy   Complete by: As directed    Increase activity slowly as tolerated   Complete by: As directed    Post-operative opioid taper instructions:   Complete by: As directed    POST-OPERATIVE OPIOID TAPER INSTRUCTIONS: It is important to wean off of your opioid  medication as soon as possible. If you do not need pain medication after your surgery it is ok to stop day one. Opioids include: Codeine, Hydrocodone(Norco, Vicodin), Oxycodone(Percocet, oxycontin) and hydromorphone amongst others.  Long term and even short term use of opiods can cause: Increased pain response Dependence Constipation Depression Respiratory depression And more.  Withdrawal symptoms can include Flu like symptoms Nausea, vomiting And more Techniques to manage these symptoms Hydrate well Eat regular healthy meals Stay active Use relaxation techniques(deep breathing, meditating, yoga) Do Not substitute Alcohol to help with tapering If you have been on opioids for less than two weeks and do not have pain than it is ok to stop all together.  Plan to wean off of opioids This plan should start within one week post op of your joint replacement. Maintain the same interval or time between taking each dose and first decrease the dose.  Cut the total daily intake of opioids by one tablet each day  Next start to increase the time between doses. The last dose that should be eliminated is the evening dose.           Follow-up Information     Mcarthur Rossetti, MD Follow up in 2 week(s).   Specialty: Orthopedic Surgery Contact information: [address] [place] [address] [phone]                  Signed: Mcarthur Rossetti 12/23/2021, 11:36 AM

## 2021-12-30 ENCOUNTER — Encounter: Payer: Self-pay | Admitting: Orthopaedic Surgery

## 2021-12-30 ENCOUNTER — Ambulatory Visit (INDEPENDENT_AMBULATORY_CARE_PROVIDER_SITE_OTHER): Payer: Commercial Managed Care - PPO | Admitting: Orthopaedic Surgery

## 2021-12-30 DIAGNOSIS — Z96641 Presence of right artificial hip joint: Secondary | ICD-10-CM

## 2021-12-30 NOTE — Progress Notes (Signed)
The patient is at his first visit today status post a right total hip arthroplasty.  He is doing well and already off of pain medication.  He has been compliant with a baby aspirin twice a day.  His right hip incision looks good.  We removed the staples and place Steri-Strips.  He had about a 40 cc seroma that I aspirated.  His leg lengths are equal.  He has have some knee pain to be expected from the surgery itself.  He had excellent questions that we answered.  He will continue increase his activities as comfort allows.  He can drop from my standpoint.  Get his incision wet in the shower but knows not to get underwater for at least 2 more weeks.  We will see him back in a month from now to see how he is doing overall but no x-rays are needed.  There are no sleep restrictions either.

## 2022-01-26 ENCOUNTER — Encounter: Payer: Self-pay | Admitting: Orthopaedic Surgery

## 2022-01-26 ENCOUNTER — Ambulatory Visit (INDEPENDENT_AMBULATORY_CARE_PROVIDER_SITE_OTHER): Payer: Commercial Managed Care - PPO | Admitting: Orthopaedic Surgery

## 2022-01-26 DIAGNOSIS — Z96641 Presence of right artificial hip joint: Secondary | ICD-10-CM

## 2022-01-26 NOTE — Progress Notes (Signed)
The patient is an active 61 year old gentleman who is now 6 weeks status post a right total hip arthroplasty.  He is doing well overall.  He is retired.  He would like to get back to playing pickle ball and tennis.  He does have some pain over the trochanteric area he states that overall he is doing well.  On exam his right operative hip is just a little stiff with rotation but there is improvements with rotation and flexion.  He will continue to increase his activities as comfort allows.  I am fine with him playing pickle ball.  The next time I need to see him is not for 6 months unless there are issues before then.  At that next visit we will have a standing low AP pelvis and lateral of his right operative hip.  All questions and concerns were answered and addressed.

## 2022-03-02 ENCOUNTER — Ambulatory Visit: Payer: Self-pay

## 2022-03-02 ENCOUNTER — Encounter: Payer: Self-pay | Admitting: Orthopedic Surgery

## 2022-03-02 ENCOUNTER — Ambulatory Visit (INDEPENDENT_AMBULATORY_CARE_PROVIDER_SITE_OTHER): Payer: Commercial Managed Care - PPO | Admitting: Orthopedic Surgery

## 2022-03-02 ENCOUNTER — Ambulatory Visit (INDEPENDENT_AMBULATORY_CARE_PROVIDER_SITE_OTHER): Payer: Commercial Managed Care - PPO

## 2022-03-02 DIAGNOSIS — M25512 Pain in left shoulder: Secondary | ICD-10-CM | POA: Diagnosis not present

## 2022-03-02 DIAGNOSIS — M25511 Pain in right shoulder: Secondary | ICD-10-CM

## 2022-03-02 NOTE — Progress Notes (Signed)
Office Visit Note   Patient: Douglas Grimes           Date of Birth: 1961/02/12           MRN: [id number] Visit Date: 03/02/2022 Requested by: [name], MD [address] [place],  [place] [address] PCP: [name], MD  Subjective: Chief Complaint  Patient presents with   Other     Bilat shoulder pain    HPI: Douglas Grimes is a 61 y.o. male who presents to the office reporting bilateral shoulder pain left worse than right.  Has had pain since February 2023.  Denies any history of injury.  He is right-hand dominant.  Does wake up with pain if he is lying on the left-hand side.  Describes catching.  Pain comes and goes with certain activities.  No neck symptoms no numbness and tingling.  No prior surgery.  Did have total hip replacement 2 months ago and is doing well with that.  No real mechanical symptoms in the shoulder.  Does have a little bit of catching in the anterolateral region.  No weakness.  He wants to keep exercising.  Currently his symptoms are manageable.  Both of his children are physical therapist and that has helped him with his motion.  For a while he states he could not put his coat on..                ROS: All systems reviewed are negative as they relate to the chief complaint within the history of present illness.  Patient denies fevers or chills.  Assessment & Plan: Visit Diagnoses:  1. Bilateral shoulder pain, unspecified chronicity     Plan: Impression is bilateral shoulder pain left worse than right.  Shoulder motion looks pretty good today along with strength.  Radiographs also nondiagnostic.  Options at this time would be observation versus subacromial injection versus MRI scanning.  For now [place] essentially did not want any type of shoulder pathology to progress to the level that his right hip progressed prior to replacement.  Do not see any red flags on exam today.  Follow-up as needed  Follow-Up Instructions: No follow-ups on  file.   Orders:  Orders Placed This Encounter  Procedures   XR Shoulder Right   XR Shoulder Left   No orders of the defined types were placed in this encounter.     Procedures: No procedures performed   Clinical Data: No additional findings.  Objective: Vital Signs: There were no vitals taken for this visit.  Physical Exam:  Constitutional: Patient appears well-developed HEENT:  Head: Normocephalic Eyes:EOM are normal Neck: Normal range of motion Cardiovascular: Normal rate Pulmonary/chest: Effort normal Neurologic: Patient is alert Skin: Skin is warm Psychiatric: Patient has normal mood and affect  Ortho Exam: Ortho exam demonstrates bilateral shoulder range of motion of 60/95/180.  Excellent rotator cuff strength bilaterally to infraspinatus supraspinatus subscap muscle strengthening testing.  No discrete AC joint tenderness.  Neck range of motion is full.  Motor or sensory function both arms intact.  Radial pulse intact.  Specialty Comments:  No specialty comments available.  Imaging: No results found.   PMFS History: Patient Active Problem List   Diagnosis Date Noted   Status post total replacement of right hip 12/17/2021   Unilateral primary osteoarthritis, right hip 11/17/2021   Overweight 03/29/2021   Hypertension, essential 02/20/2019   Prostate cancer ([place]) 02/20/2019   Maxillary sinusitis, chronic 02/26/2017   Blood  in right ear canal 07/01/2015   HYPERLIPIDEMIA 07/03/2007   Obstructive sleep apnea 07/02/2007   Seasonal and perennial allergic rhinitis 07/02/2007   Past Medical History:  Diagnosis Date   Allergic rhinitis    Arthritis    Cancer ([facility])    GERD (gastroesophageal reflux disease)    Hyperlipidemia    Hypertension    MRSA (methicillin resistant Staphylococcus aureus)    MRSA infection    right leg-posterior are behind knee   Pre-diabetes    Sleep apnea    NPSG 1990 AHI 74.1    Family History  Problem Relation Age of Onset    Sleep apnea Brother    Sleep apnea Father    Cancer Father        urinary bladder cancer    Past Surgical History:  Procedure Laterality Date   COLONOSCOPY N/A 08/12/2013   Procedure: COLONOSCOPY;  Surgeon: Juanita Craver, MD;  Location: [facility];  Service: Endoscopy;  Laterality: N/A;   MASS EXCISION  02/18/2021   PROSTATECTOMY  06/2016   TOTAL HIP ARTHROPLASTY Right 12/17/2021   Procedure: RIGHT TOTAL HIP ARTHROPLASTY ANTERIOR APPROACH;  Surgeon: Mcarthur Rossetti, MD;  Location: [facility];  Service: Orthopedics;  Laterality: Right;   Social History   Occupational History   Occupation: Programme researcher, broadcasting/film/video  Tobacco Use   Smoking status: Never   Smokeless tobacco: Never  Vaping Use   Vaping Use: Never used  Substance and Sexual Activity   Alcohol use: Yes    Comment: occassionally   Drug use: No   Sexual activity: Not on file

## 2022-03-08 ENCOUNTER — Ambulatory Visit: Payer: Commercial Managed Care - PPO | Admitting: Orthopaedic Surgery

## 2022-03-29 ENCOUNTER — Ambulatory Visit (INDEPENDENT_AMBULATORY_CARE_PROVIDER_SITE_OTHER): Payer: Commercial Managed Care - PPO | Admitting: Internal Medicine

## 2022-03-29 ENCOUNTER — Encounter: Payer: Self-pay | Admitting: Internal Medicine

## 2022-03-29 VITALS — BP 126/82 | HR 82 | Ht 72.0 in | Wt 231.2 lb

## 2022-03-29 DIAGNOSIS — J3089 Other allergic rhinitis: Secondary | ICD-10-CM | POA: Diagnosis not present

## 2022-03-29 DIAGNOSIS — J302 Other seasonal allergic rhinitis: Secondary | ICD-10-CM

## 2022-03-29 DIAGNOSIS — G4733 Obstructive sleep apnea (adult) (pediatric): Secondary | ICD-10-CM

## 2022-03-29 NOTE — Patient Instructions (Signed)
Order- DME [employer]- please replace old CPAP machine auto 12-20, mask of choice, humidifier, supplies, AirView./ card  Please call if we can help

## 2022-03-29 NOTE — Progress Notes (Signed)
HPI male never smoker followed for OSA, allergic rhinitis, complicated by GERD, prostate cancer, NPSG 10/04/88- AHI 74.1/ hr, desaturation to 67% ------------------------------------------------------------------------------------   03/29/21-61 year old male never smoker followed for OSA, allergic Rhinitis, Sinusitis complicated by GERD, prostate cancer/ radical resection, HTN, GERD, Hyperlipidemia,  CPAP auto 12-20/ [employer]    Pressure is ranging 15.5- 19.9 Download- compliance 100%. AHI 1.6/ hr Body weight today- 243 lbs Covid vax- 3 Phizer Flu vax- had He has been very comfortable with CPAP.  We discussed options including [employer], about which he had questions.  He will be eligible for replacement of his CPAP machine in another year or so but can call the homecare company for a definite date.  He denies interval concerns or problems.  03/29/22- 61 year old male never smoker followed for OSA, allergic Rhinitis, Sinusitis complicated by GERD, prostate cancer/ radical resection, HTN, GERD, Hyperlipidemia,  CPAP auto 12-20/ [employer]    Pressure is ranging 15.5- 19.9 Download- compliance 100%, AHI 1.5/ hr Body weight today- 243 lbs Covid vax- 4 Phizer Flu vax- had Discussed use of CPAPs in 2 different homes. He is due for replacement and can leave old machine at [place]. Download reviewed. 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  ROS-see HPI + = positive Constitutional:   No-   weight loss, night sweats, fevers, chills, fatigue, lassitude. HEENT:   No-  headaches, difficulty swallowing, tooth/dental problems, sore throat,       No- sneezing, itching,  No-ear ache, +nasal congestion, post nasal drip,  CV:  No-   chest pain, orthopnea, PND, swelling in lower extremities, anasarca,  dizziness, palpitations Resp: No-   shortness of breath with exertion or at rest.              No-   productive cough,  No non-productive cough,  No- coughing up of blood.              No-   change in color of  mucus.  No- wheezing.   Skin: No-   rash or lesions. GI:  No-   heartburn, indigestion, abdominal pain, nausea, vomiting GU:  MS:  No-   joint pain or swelling.   Neuro-     nothing unusual Psych:  No- change in mood or affect. No depression or anxiety.  No memory loss.  OBJ- Physical Exam    General- Alert, Oriented, Affect-appropriate, Distress- none acute,  Skin- rash-none, lesions- none, excoriation- none Lymphadenopathy- none Head- atraumatic            Eyes- Gross vision intact, PERRLA, conjunctivae and secretions clear            Ears- +Hearing aid left ear.             Nose- Clear, no-Septal dev, mucus, polyps, erosion, perforation             Throat- Mallampati III-IV , mucosa clear , drainage- none, tonsils- atrophic Neck- flexible , trachea midline, no stridor , thyroid nl, carotid no bruit Chest - symmetrical excursion , unlabored           Heart/CV- RRR , no murmur , no gallop  , no rub, nl s1 s2                           - JVD- none , edema- none, stasis changes- none, varices- none           Lung- clear to P&A, wheeze-  none, cough- none , dullness-none, rub- none           Chest wall-  Abd-  Br/ Gen/ Rectal- Not done, not indicated Extrem- cyanosis- none, clubbing, none, atrophy- none, strength- nl Neuro- grossly intact to observation

## 2022-03-29 NOTE — Assessment & Plan Note (Signed)
Benefits from CPAP with good compliance and control. Plan- replace old machine, continue auto 12-20

## 2022-03-29 NOTE — Assessment & Plan Note (Signed)
Sufficient control with occasional antihistamine

## 2022-06-30 ENCOUNTER — Encounter: Payer: Self-pay | Admitting: Radiology

## 2022-08-02 ENCOUNTER — Ambulatory Visit: Payer: Commercial Managed Care - PPO | Admitting: Orthopaedic Surgery

## 2022-08-03 ENCOUNTER — Ambulatory Visit: Payer: Commercial Managed Care - HMO | Admitting: Orthopaedic Surgery

## 2022-08-03 ENCOUNTER — Encounter: Payer: Self-pay | Admitting: Orthopaedic Surgery

## 2022-08-03 ENCOUNTER — Other Ambulatory Visit (INDEPENDENT_AMBULATORY_CARE_PROVIDER_SITE_OTHER): Payer: Commercial Managed Care - HMO

## 2022-08-03 DIAGNOSIS — Z96641 Presence of right artificial hip joint: Secondary | ICD-10-CM

## 2022-08-03 NOTE — Progress Notes (Signed)
The patient is well-known to me.  He is an active 62 year old gentleman who is now 7 months status post a right total hip arthroplasty.  He said that hip is doing very well.  His left hip started to hurt just a little bit but only on the lateral aspect of his hip.  There is no groin pain at all on the left side.  He walks with a normal-appearing gait.  His right operative hip moves smoothly and fluidly with no complicating features and no pain at all.  His left hip also move smoothly and fluidly with only some slight pain over the lateral aspect of his hip.  We reviewed his preoperative films that show him the severity of his right hip arthritis which was quite significant.  His left hip only showed some slight evidence of femoral acetabular impingement but this was very mild.  His x-rays today show well-seated right total hip arthroplasty that is bone ingrown with no complicating features.  The left hip shows some slight lateral flattening of the femoral head but the joint spaces well-maintained.  There is no cortical irregularities around the trochanteric area.  At this point follow-up for his right hip can be as needed.  If he does develop any issues with his left hip he will let us know.  Right now the only thing he would benefit from is stretching which I showed him how to do and oral anti-inflammatories or even Voltaren gel.  My next step would be considering a steroid injection and therapy.  If it worsens he will let us know.

## 2023-03-29 NOTE — Progress Notes (Unsigned)
HPI male never smoker followed for OSA, allergic rhinitis, complicated by GERD, prostate cancer, NPSG 10/04/88- AHI 74.1/ hr, desaturation to 67% ------------------------------------------------------------------------------------   03/29/22- 62 year old male never smoker followed for OSA, allergic Rhinitis, Sinusitis complicated by GERD, prostate cancer/ radical resection, HTN,  Hyperlipidemia,  CPAP auto 12-20/ [employer]    Pressure is ranging 15.5- 19.9 Download- compliance 100%, AHI 1.5/ hr Body weight today- 243 lbs Covid vax- 4 Phizer Flu vax- had Discussed use of CPAPs in 2 different homes. He is due for replacement and can [place] old machine [place]. Download reviewed. 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  03/30/23- Discussed the use of AI scribe software for clinical note transcription with the patient, who gave verbal consent to proceed.  History of Present Illness   62 year old male never smoker followed for OSA, allergic Rhinitis, Sinusitis complicated by GERD, prostate cancer/ radical resection, HTN,  Hyperlipidemia,  CPAP auto 12-20/ [employer]    Pressure is ranging 14-19 Download- compliance - 100%, AHI 1.8/ hr  Body weight today-231 lbs This machine is a year old and working well. Download reviewed. Doing well after prostate surgery 5 yrs ago and after R hip replacement 1 yr ago. Breathing comfortable. Some seasonal nasal stuffiness- able to manage.  ROS-see HPI + = positive Constitutional:   No-   weight loss, night sweats, fevers, chills, fatigue, lassitude. HEENT:   No-  headaches, difficulty swallowing, tooth/dental problems, sore throat,       No- sneezing, itching,  No-ear ache, +nasal congestion, post nasal drip,  CV:  No-   chest pain, orthopnea, PND, swelling in lower extremities, anasarca,  dizziness, palpitations Resp: No-   shortness of breath with exertion or at rest.              No-   productive cough,  No non-productive cough,  No- coughing up of  blood.              No-   change in color of mucus.  No- wheezing.   Skin: No-   rash or lesions. GI:  No-   heartburn, indigestion, abdominal pain, nausea, vomiting GU:  MS:  No-   joint pain or swelling.   Neuro-     nothing unusual Psych:  No- change in mood or affect. No depression or anxiety.  No memory loss.  OBJ- Physical Exam    General- Alert, Oriented, Affect-appropriate, Distress- none acute,  Skin- rash-none, lesions- none, excoriation- none Lymphadenopathy- none Head- atraumatic            Eyes- Gross vision intact, PERRLA, conjunctivae and secretions clear            Ears- +Hearing aid left ear.             Nose- Clear, no-Septal dev, mucus, polyps, erosion, perforation             Throat- Mallampati III-IV , mucosa clear , drainage- none, tonsils- atrophic Neck- flexible , trachea midline, no stridor , thyroid nl, carotid no bruit Chest - symmetrical excursion , unlabored           Heart/CV- RRR , no murmur , no gallop  , no rub, nl s1 s2                           - JVD- none , edema- none, stasis changes- none, varices- none  Lung- clear to P&A, wheeze- none, cough- none , dullness-none, rub- none           Chest wall-  Abd-  Br/ Gen/ Rectal- Not done, not indicated Extrem- cyanosis- none, clubbing, none, atrophy- none, strength- nl Neuro- grossly intact to observation

## 2023-03-30 ENCOUNTER — Encounter: Payer: Self-pay | Admitting: Internal Medicine

## 2023-03-30 ENCOUNTER — Ambulatory Visit: Payer: Commercial Managed Care - HMO | Admitting: Internal Medicine

## 2023-03-30 VITALS — BP 128/82 | HR 79 | Temp 98.3°F | Ht 72.0 in | Wt 231.4 lb

## 2023-03-30 DIAGNOSIS — J302 Other seasonal allergic rhinitis: Secondary | ICD-10-CM

## 2023-03-30 DIAGNOSIS — J3089 Other allergic rhinitis: Secondary | ICD-10-CM | POA: Diagnosis not present

## 2023-03-30 DIAGNOSIS — G4733 Obstructive sleep apnea (adult) (pediatric): Secondary | ICD-10-CM | POA: Diagnosis not present

## 2023-03-30 NOTE — Assessment & Plan Note (Signed)
Benefits from CPAP with good compliance and control Plan- continue auto 12-20

## 2023-03-30 NOTE — Assessment & Plan Note (Signed)
Rhinitis is not interfering with CPAP use. He can turn to nasal steroid, antihistamine as needed

## 2024-02-25 ENCOUNTER — Encounter: Payer: Self-pay | Admitting: Internal Medicine

## 2024-02-26 ENCOUNTER — Telehealth: Payer: Self-pay

## 2024-03-28 NOTE — Telephone Encounter (Signed)
 NFN

## 2024-03-29 ENCOUNTER — Telehealth: Payer: Commercial Managed Care - HMO | Admitting: Internal Medicine
